# Patient Record
Sex: Female | Born: 1955
Health system: Southern US, Community
[De-identification: ages and names within clinical notes are randomized; demographics above are authoritative.]

## PROBLEM LIST (undated history)

## (undated) DIAGNOSIS — C55 Malignant neoplasm of uterus, part unspecified: Secondary | ICD-10-CM

## (undated) DIAGNOSIS — Z8742 Personal history of other diseases of the female genital tract: Secondary | ICD-10-CM

## (undated) DIAGNOSIS — N950 Postmenopausal bleeding: Secondary | ICD-10-CM

## (undated) DIAGNOSIS — R9389 Abnormal findings on diagnostic imaging of other specified body structures: Secondary | ICD-10-CM

## (undated) DIAGNOSIS — T7840XA Allergy, unspecified, initial encounter: Secondary | ICD-10-CM

## (undated) HISTORY — PX: CERVICAL CONE BIOPSY: SUR198

## (undated) HISTORY — DX: Allergy, unspecified, initial encounter: T78.40XA

## (undated) HISTORY — DX: Malignant neoplasm of uterus, part unspecified: C55

---

## 2000-12-31 ENCOUNTER — Other Ambulatory Visit: Admission: RE | Admit: 2000-12-31 | Discharge: 2000-12-31 | Payer: Self-pay | Admitting: Internal Medicine

## 2002-04-20 ENCOUNTER — Other Ambulatory Visit: Admission: RE | Admit: 2002-04-20 | Discharge: 2002-04-20 | Payer: Self-pay | Admitting: Internal Medicine

## 2004-05-02 ENCOUNTER — Other Ambulatory Visit: Admission: RE | Admit: 2004-05-02 | Discharge: 2004-05-02 | Payer: Self-pay | Admitting: Cardiology

## 2005-11-01 ENCOUNTER — Other Ambulatory Visit: Admission: RE | Admit: 2005-11-01 | Discharge: 2005-11-01 | Payer: Self-pay | Admitting: Cardiology

## 2007-04-11 ENCOUNTER — Other Ambulatory Visit: Admission: RE | Admit: 2007-04-11 | Discharge: 2007-04-11 | Payer: Self-pay | Admitting: Internal Medicine

## 2007-07-23 ENCOUNTER — Ambulatory Visit (HOSPITAL_COMMUNITY): Admission: RE | Admit: 2007-07-23 | Discharge: 2007-07-23 | Payer: Self-pay | Admitting: *Deleted

## 2008-10-21 ENCOUNTER — Other Ambulatory Visit: Admission: RE | Admit: 2008-10-21 | Discharge: 2008-10-21 | Payer: Self-pay | Admitting: Internal Medicine

## 2010-08-04 ENCOUNTER — Other Ambulatory Visit (HOSPITAL_COMMUNITY)
Admission: RE | Admit: 2010-08-04 | Discharge: 2010-08-04 | Disposition: A | Discharge status: Home / Self Care | Payer: 59 | Source: Ambulatory Visit | Attending: Internal Medicine | Admitting: Internal Medicine

## 2010-08-04 DIAGNOSIS — Z01419 Encounter for gynecological examination (general) (routine) without abnormal findings: Secondary | ICD-10-CM | POA: Insufficient documentation

## 2010-10-31 NOTE — Op Note (Signed)
Brooke [name], Brooke [name]                ACCOUNT NO.:  [id number]   MEDICAL RECORD NO.:  [id number]          PATIENT TYPE:  AMB   LOCATION:  ENDO                         FACILITY:  [facility]   PHYSICIAN:  Georgiana Spinner, M.D.    DATE OF BIRTH:  July 26, 1955   DATE OF PROCEDURE:  07/23/2007  DATE OF DISCHARGE:                               OPERATIVE REPORT   PROCEDURE:  Colonoscopy.   INDICATIONS:  Colon cancer screening.   ANESTHESIA:  1. Fentanyl 100 mcg.  2. Versed 6 mg.   PROCEDURE:  With the patient mildly sedated in the left lateral  decubitus position, the Pentax videoscopic colonoscope was inserted in  the rectum and passed under direct vision to the cecum, identified by  the ileocecal valve and appendiceal orifice, both of which were  photographed.  From this point, the colonoscope was slowly withdrawn,  taking circumferential views of the colonic mucosa, stopping only in the  rectum, which showed hemorrhoids on direct view from anal canal and on  retroflexed view as well.  The endoscope was straightened and withdrawn.  The patient's vital signs and pulse oximeter remained stable.  The  patient tolerated procedure well, without apparent complication.   FINDINGS:  Internal hemorrhoids, otherwise an unremarkable exam.   PLAN:  Consider repeat examination in 5-10 years.           ______________________________  Georgiana Spinner, M.D.     GMO/MEDQ  D:  07/23/2007  T:  07/24/2007  Job:  [id number]

## 2013-08-17 ENCOUNTER — Other Ambulatory Visit (HOSPITAL_COMMUNITY)
Admission: RE | Admit: 2013-08-17 | Discharge: 2013-08-17 | Disposition: A | Discharge status: Home / Self Care | Payer: 59 | Source: Ambulatory Visit | Attending: Internal Medicine | Admitting: Internal Medicine

## 2013-08-17 DIAGNOSIS — Z01419 Encounter for gynecological examination (general) (routine) without abnormal findings: Secondary | ICD-10-CM | POA: Insufficient documentation

## 2014-08-24 ENCOUNTER — Other Ambulatory Visit: Payer: Self-pay

## 2014-08-24 DIAGNOSIS — Z1231 Encounter for screening mammogram for malignant neoplasm of breast: Secondary | ICD-10-CM

## 2014-08-26 ENCOUNTER — Ambulatory Visit
Admission: RE | Admit: 2014-08-26 | Discharge: 2014-08-26 | Disposition: A | Discharge status: Home / Self Care | Payer: 59 | Source: Ambulatory Visit

## 2014-08-26 DIAGNOSIS — Z1231 Encounter for screening mammogram for malignant neoplasm of breast: Secondary | ICD-10-CM

## 2015-08-12 ENCOUNTER — Other Ambulatory Visit: Payer: Self-pay

## 2015-08-12 DIAGNOSIS — Z1231 Encounter for screening mammogram for malignant neoplasm of breast: Secondary | ICD-10-CM

## 2015-08-17 DIAGNOSIS — L820 Inflamed seborrheic keratosis: Secondary | ICD-10-CM | POA: Diagnosis not present

## 2015-08-17 DIAGNOSIS — D2372 Other benign neoplasm of skin of left lower limb, including hip: Secondary | ICD-10-CM | POA: Diagnosis not present

## 2015-08-17 DIAGNOSIS — D2262 Melanocytic nevi of left upper limb, including shoulder: Secondary | ICD-10-CM | POA: Diagnosis not present

## 2015-08-17 DIAGNOSIS — L821 Other seborrheic keratosis: Secondary | ICD-10-CM | POA: Diagnosis not present

## 2015-08-17 DIAGNOSIS — L819 Disorder of pigmentation, unspecified: Secondary | ICD-10-CM | POA: Diagnosis not present

## 2015-08-17 DIAGNOSIS — D2261 Melanocytic nevi of right upper limb, including shoulder: Secondary | ICD-10-CM | POA: Diagnosis not present

## 2015-08-17 DIAGNOSIS — D225 Melanocytic nevi of trunk: Secondary | ICD-10-CM | POA: Diagnosis not present

## 2015-08-17 DIAGNOSIS — D2272 Melanocytic nevi of left lower limb, including hip: Secondary | ICD-10-CM | POA: Diagnosis not present

## 2015-08-17 DIAGNOSIS — D235 Other benign neoplasm of skin of trunk: Secondary | ICD-10-CM | POA: Diagnosis not present

## 2015-08-22 ENCOUNTER — Ambulatory Visit: Payer: Self-pay | Admitting: Obstetrics & Gynecology

## 2015-08-22 DIAGNOSIS — Z01419 Encounter for gynecological examination (general) (routine) without abnormal findings: Secondary | ICD-10-CM | POA: Diagnosis not present

## 2015-08-22 DIAGNOSIS — N840 Polyp of corpus uteri: Secondary | ICD-10-CM | POA: Diagnosis not present

## 2015-08-22 DIAGNOSIS — N950 Postmenopausal bleeding: Secondary | ICD-10-CM | POA: Diagnosis not present

## 2015-08-23 ENCOUNTER — Encounter (HOSPITAL_BASED_OUTPATIENT_CLINIC_OR_DEPARTMENT_OTHER): Payer: Self-pay | Admitting: *Deleted

## 2015-08-23 NOTE — Anesthesia Preprocedure Evaluation (Addendum)
Anesthesia Evaluation  Patient identified by MRN, date of birth, ID band Patient awake    Reviewed: Allergy & Precautions, NPO status , Patient's Chart, lab work & pertinent test results  History of Anesthesia Complications Negative for: history of anesthetic complications  Airway Mallampati: II  TM Distance: >3 FB Neck ROM: Full    Dental no notable dental hx. (+) Dental Advisory Given   Pulmonary neg pulmonary ROS,    Pulmonary exam normal breath sounds clear to auscultation       Cardiovascular negative cardio ROS Normal cardiovascular exam Rhythm:Regular Rate:Normal     Neuro/Psych negative neurological ROS  negative psych ROS   GI/Hepatic negative GI ROS, Neg liver ROS,   Endo/Other  negative endocrine ROS  Renal/GU negative Renal ROS  negative genitourinary   Musculoskeletal negative musculoskeletal ROS (+)   Abdominal   Peds negative pediatric ROS (+)  Hematology negative hematology ROS (+)   Anesthesia Other Findings Dysfunctional uterine bleeding  Reproductive/Obstetrics negative OB ROS                            Anesthesia Physical Anesthesia Plan  ASA: II  Anesthesia Plan: General   Post-op Pain Management:    Induction: Intravenous  Airway Management Planned: LMA  Additional Equipment:   Intra-op Plan:   Post-operative Plan: Extubation in OR  Informed Consent: I have reviewed the patients History and Physical, chart, labs and discussed the procedure including the risks, benefits and alternatives for the proposed anesthesia with the patient or authorized representative who has indicated his/her understanding and acceptance.   Dental advisory given  Plan Discussed with: CRNA  Anesthesia Plan Comments:         Anesthesia Quick Evaluation

## 2015-08-23 NOTE — Progress Notes (Addendum)
UNABLE TO REACH PT LEFT MESSAGE ON HOME AND CELL TO BE NPO AFTER MN.  ARRIVE AT 0700.  NEEDS ISTAT AND HX.

## 2015-08-24 ENCOUNTER — Other Ambulatory Visit: Payer: Self-pay

## 2015-08-24 ENCOUNTER — Ambulatory Visit (HOSPITAL_BASED_OUTPATIENT_CLINIC_OR_DEPARTMENT_OTHER): Payer: 59 | Admitting: Anesthesiology

## 2015-08-24 ENCOUNTER — Encounter (HOSPITAL_BASED_OUTPATIENT_CLINIC_OR_DEPARTMENT_OTHER): Payer: Self-pay | Admitting: *Deleted

## 2015-08-24 ENCOUNTER — Encounter (HOSPITAL_BASED_OUTPATIENT_CLINIC_OR_DEPARTMENT_OTHER)
Admission: RE | Disposition: A | Discharge status: Home / Self Care | Payer: Self-pay | Source: Ambulatory Visit | Attending: Obstetrics & Gynecology

## 2015-08-24 ENCOUNTER — Ambulatory Visit (HOSPITAL_BASED_OUTPATIENT_CLINIC_OR_DEPARTMENT_OTHER)
Admission: RE | Admit: 2015-08-24 | Discharge: 2015-08-24 | Disposition: A | Discharge status: Home / Self Care | Payer: 59 | Source: Ambulatory Visit | Attending: Obstetrics & Gynecology | Admitting: Obstetrics & Gynecology

## 2015-08-24 DIAGNOSIS — C541 Malignant neoplasm of endometrium: Secondary | ICD-10-CM | POA: Diagnosis not present

## 2015-08-24 DIAGNOSIS — N840 Polyp of corpus uteri: Secondary | ICD-10-CM | POA: Diagnosis not present

## 2015-08-24 DIAGNOSIS — N950 Postmenopausal bleeding: Secondary | ICD-10-CM | POA: Diagnosis not present

## 2015-08-24 DIAGNOSIS — I10 Essential (primary) hypertension: Secondary | ICD-10-CM | POA: Insufficient documentation

## 2015-08-24 DIAGNOSIS — N858 Other specified noninflammatory disorders of uterus: Secondary | ICD-10-CM | POA: Diagnosis not present

## 2015-08-24 HISTORY — DX: Postmenopausal bleeding: N95.0

## 2015-08-24 HISTORY — DX: Personal history of other diseases of the female genital tract: Z87.42

## 2015-08-24 HISTORY — DX: Abnormal findings on diagnostic imaging of other specified body structures: R93.89

## 2015-08-24 HISTORY — PX: HYSTEROSCOPY WITH D & C: SHX1775

## 2015-08-24 LAB — POCT I-STAT 4, (NA,K, GLUC, HGB,HCT)
GLUCOSE: 100 mg/dL — AB (ref 65–99)
HEMATOCRIT: 39 % (ref 36.0–46.0)
HEMOGLOBIN: 13.3 g/dL (ref 12.0–15.0)
Potassium: 4.3 mmol/L (ref 3.5–5.1)
Sodium: 141 mmol/L (ref 135–145)

## 2015-08-24 SURGERY — DILATATION AND CURETTAGE /HYSTEROSCOPY
Anesthesia: General | Site: Vagina

## 2015-08-24 MED ORDER — DEXAMETHASONE SODIUM PHOSPHATE 10 MG/ML IJ SOLN
INTRAMUSCULAR | Status: AC
  Filled 2015-08-24: qty 1

## 2015-08-24 MED ORDER — LACTATED RINGERS IV SOLN
INTRAVENOUS | Status: DC
  Administered 2015-08-24 (×2): via INTRAVENOUS
  Filled 2015-08-24: qty 1000

## 2015-08-24 MED ORDER — LIDOCAINE HCL (CARDIAC) 20 MG/ML IV SOLN
INTRAVENOUS | Status: DC | PRN
  Administered 2015-08-24: 60 mg via INTRAVENOUS

## 2015-08-24 MED ORDER — ONDANSETRON HCL 4 MG/2ML IJ SOLN
INTRAMUSCULAR | Status: AC
  Filled 2015-08-24: qty 2

## 2015-08-24 MED ORDER — LIDOCAINE HCL (CARDIAC) 20 MG/ML IV SOLN
INTRAVENOUS | Status: AC
  Filled 2015-08-24: qty 5

## 2015-08-24 MED ORDER — FENTANYL CITRATE (PF) 100 MCG/2ML IJ SOLN
25.0000 ug | INTRAMUSCULAR | Status: DC | PRN
  Filled 2015-08-24: qty 1

## 2015-08-24 MED ORDER — ONDANSETRON HCL 4 MG/2ML IJ SOLN
4.0000 mg | Freq: Once | INTRAMUSCULAR | Status: DC | PRN
  Filled 2015-08-24: qty 2

## 2015-08-24 MED ORDER — DEXAMETHASONE SODIUM PHOSPHATE 4 MG/ML IJ SOLN
INTRAMUSCULAR | Status: DC | PRN
  Administered 2015-08-24: 10 mg via INTRAVENOUS

## 2015-08-24 MED ORDER — FENTANYL CITRATE (PF) 100 MCG/2ML IJ SOLN
INTRAMUSCULAR | Status: DC | PRN
  Administered 2015-08-24 (×4): 25 ug via INTRAVENOUS

## 2015-08-24 MED ORDER — ARTIFICIAL TEARS OP OINT
TOPICAL_OINTMENT | OPHTHALMIC | Status: AC
  Filled 2015-08-24: qty 3.5

## 2015-08-24 MED ORDER — CLINDAMYCIN PHOSPHATE 600 MG/50ML IV SOLN
600.0000 mg | Freq: Once | INTRAVENOUS | Status: AC
  Administered 2015-08-24: 600 mg via INTRAVENOUS
  Filled 2015-08-24: qty 50

## 2015-08-24 MED ORDER — ONDANSETRON HCL 4 MG/2ML IJ SOLN
INTRAMUSCULAR | Status: DC | PRN
  Administered 2015-08-24: 4 mg via INTRAVENOUS

## 2015-08-24 MED ORDER — FENTANYL CITRATE (PF) 100 MCG/2ML IJ SOLN
INTRAMUSCULAR | Status: AC
  Filled 2015-08-24: qty 2

## 2015-08-24 MED ORDER — MIDAZOLAM HCL 2 MG/2ML IJ SOLN
INTRAMUSCULAR | Status: AC
Start: 2015-08-24 — End: 2015-08-24
  Filled 2015-08-24: qty 2

## 2015-08-24 MED ORDER — MIDAZOLAM HCL 5 MG/5ML IJ SOLN
INTRAMUSCULAR | Status: DC | PRN
  Administered 2015-08-24: 2 mg via INTRAVENOUS

## 2015-08-24 MED ORDER — CLINDAMYCIN PHOSPHATE 600 MG/50ML IV SOLN
INTRAVENOUS | Status: AC
  Filled 2015-08-24: qty 50

## 2015-08-24 MED ORDER — PROPOFOL 10 MG/ML IV BOLUS
INTRAVENOUS | Status: AC
  Filled 2015-08-24: qty 40

## 2015-08-24 MED ORDER — GLYCINE 1.5 % IR SOLN
Status: DC | PRN
  Administered 2015-08-24: 3000 mL

## 2015-08-24 MED ORDER — PROPOFOL 10 MG/ML IV BOLUS
INTRAVENOUS | Status: DC | PRN
  Administered 2015-08-24: 160 mg via INTRAVENOUS

## 2015-08-24 MED FILL — HYDROCODON-APAP 5-325: 5-325 | 2 days supply | Qty: 10 | Fill #0

## 2015-08-24 SURGICAL SUPPLY — 33 items
ABLATOR ENDOMETRIAL BIPOLAR (ABLATOR) IMPLANT
CANISTER SUCTION 2500CC (MISCELLANEOUS) ×2 IMPLANT
CATH ROBINSON RED A/P 16FR (CATHETERS) IMPLANT
COVER BACK TABLE 60X90IN (DRAPES) ×2 IMPLANT
DRAPE HYSTEROSCOPY (DRAPE) ×2 IMPLANT
DRAPE LG THREE QUARTER DISP (DRAPES) ×2 IMPLANT
ELECT REM PT RETURN 9FT ADLT (ELECTROSURGICAL)
ELECTRODE REM PT RTRN 9FT ADLT (ELECTROSURGICAL) IMPLANT
GLOVE BIO SURGEON STRL SZ7.5 (GLOVE) ×2 IMPLANT
GLOVE BIOGEL M 6.5 STRL (GLOVE) ×2 IMPLANT
GLOVE BIOGEL PI IND STRL 6.5 (GLOVE) ×1 IMPLANT
GLOVE BIOGEL PI IND STRL 7.5 (GLOVE) ×1 IMPLANT
GLOVE BIOGEL PI INDICATOR 6.5 (GLOVE) ×1
GLOVE BIOGEL PI INDICATOR 7.5 (GLOVE) ×1
GOWN STRL REUS W/ TWL LRG LVL3 (GOWN DISPOSABLE) ×1 IMPLANT
GOWN STRL REUS W/ TWL XL LVL3 (GOWN DISPOSABLE) ×2 IMPLANT
GOWN STRL REUS W/TWL LRG LVL3 (GOWN DISPOSABLE) ×1
GOWN STRL REUS W/TWL XL LVL3 (GOWN DISPOSABLE) ×2
KIT ROOM TURNOVER WOR (KITS) ×2 IMPLANT
LEGGING LITHOTOMY PAIR STRL (DRAPES) ×2 IMPLANT
LOOP ANGLED CUTTING 22FR (CUTTING LOOP) IMPLANT
MANIFOLD NEPTUNE II (INSTRUMENTS) IMPLANT
NEEDLE SPNL 22GX3.5 QUINCKE BK (NEEDLE) IMPLANT
PACK BASIN DAY SURGERY FS (CUSTOM PROCEDURE TRAY) ×2 IMPLANT
PAD OB MATERNITY 4.3X12.25 (PERSONAL CARE ITEMS) ×2 IMPLANT
PAD PREP 24X48 CUFFED NSTRL (MISCELLANEOUS) ×2 IMPLANT
SYR CONTROL 10ML LL (SYRINGE) IMPLANT
TOWEL OR 17X24 6PK STRL BLUE (TOWEL DISPOSABLE) ×4 IMPLANT
TRAY DSU PREP LF (CUSTOM PROCEDURE TRAY) ×2 IMPLANT
TUBE CONNECTING 12X1/4 (SUCTIONS) IMPLANT
TUBING AQUILEX INFLOW (TUBING) ×2 IMPLANT
TUBING AQUILEX OUTFLOW (TUBING) ×2 IMPLANT
WATER STERILE IRR 1000ML POUR (IV SOLUTION) ×2 IMPLANT

## 2015-08-24 NOTE — Anesthesia Procedure Notes (Signed)
Procedure Name: LMA Insertion Date/Time: 08/24/2015 8:36 AM Performed by: Mechele Claude Pre-anesthesia Checklist: Patient identified, Emergency Drugs available, Suction available and Patient being monitored Patient Re-evaluated:Patient Re-evaluated prior to inductionOxygen Delivery Method: Circle System Utilized Preoxygenation: Pre-oxygenation with 100% oxygen Intubation Type: IV induction Ventilation: Mask ventilation without difficulty LMA: LMA inserted LMA Size: 4.0 Number of attempts: 1 Airway Equipment and Method: bite block Placement Confirmation: positive ETCO2 Tube secured with: Tape Dental Injury: Teeth and Oropharynx as per pre-operative assessment

## 2015-08-24 NOTE — Discharge Instructions (Signed)

## 2015-08-24 NOTE — Transfer of Care (Signed)
  Last Vitals:  Filed Vitals:   08/24/15 0726  BP: 123/79  Pulse: 59  Temp: 37.1 C  Resp: 16    Immediate Anesthesia Transfer of Care Note  Patient: Brooke [name]  Procedure(s) Performed: Procedure(s) (LRB): DILATATION AND CURETTAGE /HYSTEROSCOPY (N/A)  Patient Location: PACU  Anesthesia Type: General  Level of Consciousness: awake, alert  and oriented  Airway & Oxygen Therapy: Patient Spontanous Breathing and Patient connected to face mask oxygen  Post-op Assessment: Report given to PACU RN and Post -op Vital signs reviewed and stable  Post vital signs: Reviewed and stable  Complications: No apparent anesthesia complications

## 2015-08-24 NOTE — Op Note (Signed)
NAMEJANERA, [name] NO.:  [id number]  MEDICAL RECORD NO.:  [id number]  LOCATION:                                 FACILITY:  PHYSICIAN:  Maisie Fus, M.D.   DATE OF BIRTH:  [birth year]  DATE OF PROCEDURE:  08/24/2015 DATE OF DISCHARGE:                              OPERATIVE REPORT   PREOPERATIVE DIAGNOSIS:  Postmenopausal bleeding with 2.5-cm mass effect in the uterine cavity with a note of increased blood flow to the mass.  POSTOPERATIVE DIAGNOSIS:  Narcotic tumor-looking material.  OPERATIVE PROCEDURE:  Hysteroscopy, D and C.  SURGEON:  Maisie Fus, M.D.  ANESTHESIA:  General.  ESTIMATED INTRAOPERATIVE BLOOD LOSS:  100 mL.  INTRAOPERATIVE COMPLICATIONS:  None.  The patient was referred to our office by her dermatologist after report of postmenopausal bleeding, which apparently have been discussed with general practitioner's office in the past, but with no previous workup. At her initial visit here in the office, she had a pelvic ultrasound, which showed 2.5-cm mass of tissue in the uterine cavity with an increased in blood flow by ultrasound findings.  She was admitted now for hysteroscopy, D and C.  DESCRIPTION OF PROCEDURE:  She was admitted in the morning of surgery. She was brought to the operating room and placed under general anesthesia, placed in dorsal lithotomy position using the [facility] stirrup system.  Betadine prep of mons, perineum and vagina was carried out in the usual fashion.  Sterile drapes were applied.  Bivalve speculum was then introduced because of narrowing of the vaginal introitus.  This allowed visualization of the cervix, which was somewhat flushed with the vaginal vault.  There was what appeared to be polypoid-like tissue coming from the os.  Using a Hegar dilator, the cervix was progressively dilated to 23.  The hysteroscope was introduced, but because of inability to clear the bloody-looking material, no definite  image of a mass could be obtained.  It was elected to place a Heaney curette and gentle thorough curettage with finding of large amount of disintegrating necrotic-looking tissue consistent with malignant-type tissue.  This was repeated and continued to was felt like the cavity had been evacuated. Repeated attempts to visualize were unsuccessful because of blood staining and for that reason, no actual images of the tissue could be obtained with the hysteroscope.  At this point, the procedure was terminated.  Instruments removed.  The patient was awakened, she was taken to recovery in good condition.  She will be given routine outpatient surgical instructions.  She will likely be referred in the very near future to [facility].     Maisie Fus, M.D.     WRN/MEDQ  D:  08/24/2015  T:  08/24/2015  Job:  [id number]

## 2015-08-24 NOTE — Anesthesia Postprocedure Evaluation (Signed)
Anesthesia Post Note  Patient: Brooke [name]  Procedure(s) Performed: Procedure(s) (LRB): DILATATION AND CURETTAGE /HYSTEROSCOPY (N/A)  Patient location during evaluation: PACU Anesthesia Type: General Level of consciousness: awake and alert Pain management: pain level controlled Vital Signs Assessment: post-procedure vital signs reviewed and stable Respiratory status: spontaneous breathing, nonlabored ventilation, respiratory function stable and patient connected to nasal cannula oxygen Cardiovascular status: blood pressure returned to baseline and stable Postop Assessment: no signs of nausea or vomiting Anesthetic complications: no    Last Vitals:  Filed Vitals:   08/24/15 1000 08/24/15 1010  BP: 120/69   Pulse: 43 40  Temp:    Resp: 15 15    Last Pain:  Filed Vitals:   08/24/15 1023  PainSc: Asleep                 Airianna Kreischer JENNETTE

## 2015-08-25 ENCOUNTER — Encounter (HOSPITAL_BASED_OUTPATIENT_CLINIC_OR_DEPARTMENT_OTHER): Payer: Self-pay | Admitting: Obstetrics & Gynecology

## 2015-08-26 DIAGNOSIS — E559 Vitamin D deficiency, unspecified: Secondary | ICD-10-CM | POA: Diagnosis not present

## 2015-08-26 DIAGNOSIS — Z13220 Encounter for screening for lipoid disorders: Secondary | ICD-10-CM | POA: Diagnosis not present

## 2015-08-26 DIAGNOSIS — Z0001 Encounter for general adult medical examination with abnormal findings: Secondary | ICD-10-CM | POA: Diagnosis not present

## 2015-08-30 ENCOUNTER — Ambulatory Visit
Admission: RE | Admit: 2015-08-30 | Discharge: 2015-08-30 | Disposition: A | Discharge status: Home / Self Care | Payer: 59 | Source: Ambulatory Visit

## 2015-08-30 DIAGNOSIS — Z1231 Encounter for screening mammogram for malignant neoplasm of breast: Secondary | ICD-10-CM

## 2015-08-31 DIAGNOSIS — E559 Vitamin D deficiency, unspecified: Secondary | ICD-10-CM | POA: Diagnosis not present

## 2015-08-31 DIAGNOSIS — Z0000 Encounter for general adult medical examination without abnormal findings: Secondary | ICD-10-CM | POA: Diagnosis not present

## 2015-08-31 DIAGNOSIS — R945 Abnormal results of liver function studies: Secondary | ICD-10-CM | POA: Diagnosis not present

## 2015-09-02 ENCOUNTER — Other Ambulatory Visit (HOSPITAL_COMMUNITY): Payer: Self-pay | Admitting: Internal Medicine

## 2015-09-02 DIAGNOSIS — R748 Abnormal levels of other serum enzymes: Secondary | ICD-10-CM

## 2015-09-06 ENCOUNTER — Ambulatory Visit (HOSPITAL_COMMUNITY)
Admission: RE | Admit: 2015-09-06 | Discharge: 2015-09-06 | Disposition: A | Discharge status: Home / Self Care | Payer: 59 | Source: Ambulatory Visit | Attending: Internal Medicine | Admitting: Internal Medicine

## 2015-09-06 DIAGNOSIS — K760 Fatty (change of) liver, not elsewhere classified: Secondary | ICD-10-CM | POA: Insufficient documentation

## 2015-09-06 DIAGNOSIS — R748 Abnormal levels of other serum enzymes: Secondary | ICD-10-CM | POA: Diagnosis not present

## 2015-09-07 ENCOUNTER — Ambulatory Visit: Payer: 59 | Attending: Gynecologic Oncology | Admitting: Gynecologic Oncology

## 2015-09-07 ENCOUNTER — Encounter: Payer: Self-pay | Admitting: Gynecologic Oncology

## 2015-09-07 VITALS — BP 129/90 | HR 64 | Temp 97.6°F | Resp 18 | Ht 64.25 in | Wt 169.5 lb

## 2015-09-07 DIAGNOSIS — R7989 Other specified abnormal findings of blood chemistry: Secondary | ICD-10-CM | POA: Diagnosis not present

## 2015-09-07 DIAGNOSIS — K760 Fatty (change of) liver, not elsewhere classified: Secondary | ICD-10-CM | POA: Insufficient documentation

## 2015-09-07 DIAGNOSIS — E7800 Pure hypercholesterolemia, unspecified: Secondary | ICD-10-CM | POA: Insufficient documentation

## 2015-09-07 DIAGNOSIS — E781 Pure hyperglyceridemia: Secondary | ICD-10-CM | POA: Insufficient documentation

## 2015-09-07 DIAGNOSIS — Z880 Allergy status to penicillin: Secondary | ICD-10-CM | POA: Diagnosis not present

## 2015-09-07 DIAGNOSIS — C541 Malignant neoplasm of endometrium: Secondary | ICD-10-CM | POA: Insufficient documentation

## 2015-09-07 DIAGNOSIS — E663 Overweight: Secondary | ICD-10-CM | POA: Insufficient documentation

## 2015-09-07 DIAGNOSIS — N950 Postmenopausal bleeding: Secondary | ICD-10-CM | POA: Insufficient documentation

## 2015-09-07 NOTE — Patient Instructions (Signed)
Plan for surgery at [facility] with Dr. Alycia Rossetti on April 7.  Your pre-operative appointment at [facility] will be March 27 at 1:30pm.  Please call for any questions or concerns.

## 2015-09-07 NOTE — Progress Notes (Signed)
Consult Note: Gyn-Onc  Consult was requested by Dr. Nori Riis for the evaluation of Brooke [name] 60 y.o. female  CC:  Chief Complaint  Patient presents with  . endometrial cancer    New Consultation    Assessment/Plan:  Brooke [name]  is a 60 y.o.  year old with clinical stage I grade 2 endometrial cancer. Mildly elevated LFT's (due to hepatic steatosis). Hypercholesterolemia, not on statins.  A detailed discussion was held with the patient and her family with regard to to her endometrial cancer diagnosis. We discussed the standard management options for uterine cancer which includes surgery followed possibly by adjuvant therapy depending on the results of surgery. The options for surgical management include a hysterectomy and removal of the tubes and ovaries with sentinel lymph node biopsy, possibly with removal of pelvic and para-aortic lymph nodes. A minimally invasive approach including a robotic hysterectomy or laparoscopic hysterectomy have benefits including shorter hospital stay, recovery time and better wound healing. The alternative approach is an open hysterectomy. The patient has been counseled about these surgical options and the risks of surgery in general including infection, bleeding, damage to surrounding structures (including bowel, bladder, ureters, nerves or vessels), and the postoperative risks of PE/ DVT, and lymphedema. I extensively reviewed the additional risks of robotic hysterectomy including possible need for conversion to open laparotomy.  I discussed positioning during surgery of trendelenberg and risks of minor facial swelling and care we take in preoperative positioning.  After counseling and consideration of her options, she desires to proceed with robotic hysterectomy, BSO, SLN biopsy.   She desires surgery to be expedited and lives in [place] near [place], therefore I am scheduling her to have the surgery performed by my partner, Dr Nancy Marus, at [facility] on  April 7th.  She will be seen by anesthesia for preoperative clearance and discussion of postoperative pain management.  She was given the opportunity to ask questions, which were answered to her satisfaction, and she is agreement with the above mentioned plan of care.  HPI: Brooke [name] is a very pleasant G0 woman who is seen in consultation at the request of Dr Nori Riis for grade 2 endometrial cancer.  The patient has a history of post-menopausal bleeding since October 2016.  She underwent US of the pelvis on 08/22/15 with Dr Nori Riis which revealed a uterus measuring 6.9 x 3.3x5.1cm and a thickened endometrial stripe.  A hysteroscopy, D&C was performed on 08/24/15 and pathology revealed FIGO grade 2 endometrial cancer.    She is otherwise very healthy. Postoperative compressive metabolic panel drawn on [id number] reveals a mildly elevated ALC at 55 (upper limit of normal is 32). AST was also mildly elevated at 37 (upper limit of normal is 32). She subsequently had an abdominal ultrasound scan on 09/16/2015 which revealed a liver with mild increased echogenicity compatible with hepatic steatosis. Otherwise the ultrasound was unremarkable. She has a history of hypertriglyceridemia and takes 1000 mg of fish oil for this but no statins. She's had no prior abdominal surgeries.  She has a remote history of a CKC in 2996 for high grade dysplasia, but normal paps since that time.  She is nulliparous.   She has no known family history for malignancy because she is adopted.  She is overweight with a BMI of 28.8kg/m2.   Current Meds:  Outpatient Encounter Prescriptions as of 09/07/2015  Medication Sig  . calcium carbonate (OS-CAL - DOSED IN MG OF ELEMENTAL CALCIUM) 1250 (500 Ca) MG tablet Take  2 tablets by mouth 2 (two) times daily with a meal.  . cetirizine (ZYRTEC) 10 MG tablet Take 10 mg by mouth daily.  . Cholecalciferol (VITAMIN D3) 2000 units TABS Take 2,000 tablets by mouth daily.  . [DISCONTINUED]  Vitamin D, Ergocalciferol, (DRISDOL) 50000 units CAPS capsule Take 4,000 Units by mouth every 7 (seven) days.   No facility-administered encounter medications on file as of 09/07/2015.    Allergy:  Allergies  Allergen Reactions  . Penicillins Swelling    Rash,swelling on face,upper chest  . Strawberry (Diagnostic) Rash    Social Hx:   Social History   Social History  . Marital Status: Married    Spouse Name: N/A  . Number of Children: N/A  . Years of Education: N/A   Occupational History  . Not on file.   Social History Main Topics  . Smoking status: Never Smoker   . Smokeless tobacco: Never Used  . Alcohol Use: 1.2 oz/week    2 Cans of beer per week  . Drug Use: No  . Sexual Activity: Not on file   Other Topics Concern  . Not on file   Social History Narrative    Past Surgical Hx:  Past Surgical History  Procedure Laterality Date  . Cervical cone biopsy    . Hysteroscopy w/d&c N/A 08/24/2015    Procedure: DILATATION AND CURETTAGE /HYSTEROSCOPY;  Surgeon: Maisie Fus, MD;  Location: [facility];  Service: Gynecology;  Laterality: N/A;    Past Medical Hx:  Past Medical History  Diagnosis Date  . History of abnormal cervical Pap smear   . PMB (postmenopausal bleeding)   . Thickened endometrium   . Allergy     Past Gynecological History:  CKC for dysplasia 1996. G0  No LMP recorded. Patient is postmenopausal.  Family Hx:  Family History  Problem Relation Age of Onset  . Adopted: Yes    Review of Systems:  Constitutional  Feels well,    ENT Normal appearing ears and nares bilaterally Skin/Breast  No rash, sores, jaundice, itching, dryness Cardiovascular  No chest pain, shortness of breath, or edema  Pulmonary  No cough or wheeze.  Gastro Intestinal  No nausea, vomitting, or diarrhoea. No bright red blood per rectum, no abdominal pain, change in bowel movement, or constipation.  Genito Urinary  No frequency, urgency, dysuria, +  PMP bleeding Musculo Skeletal  No myalgia, arthralgia, joint swelling or pain  Neurologic  No weakness, numbness, change in gait,  Psychology  No depression, anxiety, insomnia.   Vitals:  Blood pressure 129/90, pulse 64, temperature 97.6 F (36.4 C), temperature source Oral, resp. rate 18, height 5' 4.25" (1.632 m), weight 169 lb 8 oz (76.885 kg), SpO2 100 %.  Physical Exam: WD in NAD Neck  Supple NROM, without any enlargements.  Lymph Node Survey No cervical supraclavicular or inguinal adenopathy Cardiovascular  Pulse normal rate, regularity and rhythm. S1 and S2 normal.  Lungs  Clear to auscultation bilateraly, without wheezes/crackles/rhonchi. Good air movement.  Skin  No rash/lesions/breakdown  Psychiatry  Alert and oriented to person, place, and time  Abdomen  Normoactive bowel sounds, abdomen soft, non-tender and slightly overweight without evidence of hernia. Back No CVA tenderness Genito Urinary  Vulva/vagina: Normal external female genitalia.  No lesions. No discharge or bleeding.  Bladder/urethra:  No lesions or masses, well supported bladder  Vagina: normal  Cervix: Normal appearing, no lesions. There is some scaring and tethering of the posterior vaginal fornix to the cervix,  most consistent with scar after CKC.  Uterus: Small, mobile, no parametrial involvement or nodularity.  Adnexa: no palpable masses. Rectal  Good tone, no masses no cul de sac nodularity.  Extremities  No bilateral cyanosis, clubbing or edema.   Donaciano Eva, MD  09/07/2015, 12:50 PM

## 2015-09-08 DIAGNOSIS — R749 Abnormal serum enzyme level, unspecified: Secondary | ICD-10-CM | POA: Diagnosis not present

## 2015-09-12 DIAGNOSIS — K760 Fatty (change of) liver, not elsewhere classified: Secondary | ICD-10-CM | POA: Diagnosis not present

## 2015-09-12 DIAGNOSIS — Z6828 Body mass index (BMI) 28.0-28.9, adult: Secondary | ICD-10-CM | POA: Diagnosis not present

## 2015-09-12 DIAGNOSIS — C541 Malignant neoplasm of endometrium: Secondary | ICD-10-CM | POA: Diagnosis not present

## 2015-09-12 DIAGNOSIS — Z79891 Long term (current) use of opiate analgesic: Secondary | ICD-10-CM | POA: Diagnosis not present

## 2015-09-12 DIAGNOSIS — E663 Overweight: Secondary | ICD-10-CM | POA: Diagnosis not present

## 2015-09-12 DIAGNOSIS — Z01818 Encounter for other preprocedural examination: Secondary | ICD-10-CM | POA: Diagnosis not present

## 2015-09-12 DIAGNOSIS — Z87898 Personal history of other specified conditions: Secondary | ICD-10-CM | POA: Diagnosis not present

## 2015-09-12 DIAGNOSIS — J309 Allergic rhinitis, unspecified: Secondary | ICD-10-CM | POA: Diagnosis not present

## 2015-09-12 DIAGNOSIS — E781 Pure hyperglyceridemia: Secondary | ICD-10-CM | POA: Diagnosis not present

## 2015-09-20 DIAGNOSIS — H5203 Hypermetropia, bilateral: Secondary | ICD-10-CM | POA: Diagnosis not present

## 2015-09-20 DIAGNOSIS — H524 Presbyopia: Secondary | ICD-10-CM | POA: Diagnosis not present

## 2015-09-20 DIAGNOSIS — H52223 Regular astigmatism, bilateral: Secondary | ICD-10-CM | POA: Diagnosis not present

## 2015-09-23 DIAGNOSIS — N858 Other specified noninflammatory disorders of uterus: Secondary | ICD-10-CM | POA: Diagnosis not present

## 2015-09-23 DIAGNOSIS — J309 Allergic rhinitis, unspecified: Secondary | ICD-10-CM | POA: Diagnosis not present

## 2015-09-23 DIAGNOSIS — D259 Leiomyoma of uterus, unspecified: Secondary | ICD-10-CM | POA: Diagnosis not present

## 2015-09-23 DIAGNOSIS — Z6828 Body mass index (BMI) 28.0-28.9, adult: Secondary | ICD-10-CM | POA: Diagnosis not present

## 2015-09-23 DIAGNOSIS — C541 Malignant neoplasm of endometrium: Secondary | ICD-10-CM | POA: Diagnosis not present

## 2015-09-23 DIAGNOSIS — N838 Other noninflammatory disorders of ovary, fallopian tube and broad ligament: Secondary | ICD-10-CM | POA: Diagnosis not present

## 2015-09-23 DIAGNOSIS — N9489 Other specified conditions associated with female genital organs and menstrual cycle: Secondary | ICD-10-CM | POA: Diagnosis not present

## 2015-09-23 DIAGNOSIS — E663 Overweight: Secondary | ICD-10-CM | POA: Diagnosis not present

## 2015-09-23 DIAGNOSIS — N888 Other specified noninflammatory disorders of cervix uteri: Secondary | ICD-10-CM | POA: Diagnosis not present

## 2015-09-24 DIAGNOSIS — D259 Leiomyoma of uterus, unspecified: Secondary | ICD-10-CM | POA: Diagnosis not present

## 2015-09-24 DIAGNOSIS — J309 Allergic rhinitis, unspecified: Secondary | ICD-10-CM | POA: Diagnosis not present

## 2015-09-24 DIAGNOSIS — C541 Malignant neoplasm of endometrium: Secondary | ICD-10-CM | POA: Diagnosis not present

## 2015-09-24 DIAGNOSIS — E663 Overweight: Secondary | ICD-10-CM | POA: Diagnosis not present

## 2015-09-24 DIAGNOSIS — N838 Other noninflammatory disorders of ovary, fallopian tube and broad ligament: Secondary | ICD-10-CM | POA: Diagnosis not present

## 2015-09-24 DIAGNOSIS — N9489 Other specified conditions associated with female genital organs and menstrual cycle: Secondary | ICD-10-CM | POA: Diagnosis not present

## 2015-09-24 DIAGNOSIS — N888 Other specified noninflammatory disorders of cervix uteri: Secondary | ICD-10-CM | POA: Diagnosis not present

## 2015-09-24 DIAGNOSIS — N858 Other specified noninflammatory disorders of uterus: Secondary | ICD-10-CM | POA: Diagnosis not present

## 2015-09-24 DIAGNOSIS — Z6828 Body mass index (BMI) 28.0-28.9, adult: Secondary | ICD-10-CM | POA: Diagnosis not present

## 2015-10-31 ENCOUNTER — Telehealth: Payer: Self-pay

## 2015-10-31 ENCOUNTER — Other Ambulatory Visit: Payer: Self-pay | Admitting: Gynecologic Oncology

## 2015-10-31 DIAGNOSIS — C541 Malignant neoplasm of endometrium: Secondary | ICD-10-CM | POA: Diagnosis not present

## 2015-10-31 NOTE — Telephone Encounter (Signed)
Patient called with request to have a referral placed to Genetic Counseling , [facility] , APNP updated . Referral for genetic counseling waws placed and the patient was updated . Patient states understanding , is aware that she will be receiving a call , denies further questions at this time.

## 2015-10-31 NOTE — Progress Notes (Signed)
Pt called and requested referral to genetics.

## 2015-11-11 ENCOUNTER — Encounter: Payer: Self-pay | Admitting: Genetic Counselor

## 2015-11-15 MED FILL — OMEGA-3 ETHYL ESTERS 1 GM C: 1 | 90 days supply | Qty: 180 | Fill #0

## 2015-12-28 ENCOUNTER — Encounter: Payer: Self-pay | Admitting: Genetic Counselor

## 2015-12-28 ENCOUNTER — Other Ambulatory Visit: Payer: 59

## 2015-12-28 ENCOUNTER — Ambulatory Visit (HOSPITAL_BASED_OUTPATIENT_CLINIC_OR_DEPARTMENT_OTHER): Payer: 59 | Admitting: Genetic Counselor

## 2015-12-28 DIAGNOSIS — C541 Malignant neoplasm of endometrium: Secondary | ICD-10-CM

## 2015-12-28 DIAGNOSIS — Z315 Encounter for genetic counseling: Secondary | ICD-10-CM | POA: Diagnosis not present

## 2015-12-28 NOTE — Progress Notes (Signed)
REFERRING PROVIDER: Dorothyann Gibbs, NP [place], [place] [address]  PRIMARY PROVIDER:  No primary care provider on file.  PRIMARY REASON FOR VISIT:  1. Endometrial cancer ([place])      HISTORY OF PRESENT ILLNESS:   Ms. Brass, a 60 y.o. female, was seen for a [facility] [place] cancer genetics consultation at the request of Joylene John due to a personal history of cancer.  Ms. Ellerman presents to clinic today to discuss the possibility of a hereditary predisposition to cancer, genetic testing, and to further clarify her future cancer risks, as well as potential cancer risks for family members.   In 2017, at the age of 39, Ms. Pacifico was diagnosed with endometrial cancer. This was treated with complete hysterectomy.  Tumor testing found MSI - H and loss of MLH1 and PMS2.  Further testing found hypermethylation of MLH1, making Lynch syndrome less likely.  Ms. Spargo is adopted with no information about her family history.   CANCER HISTORY:   No history exists.     HORMONAL RISK FACTORS:  Menarche was at age 57.  First live birth at age N/A.  Ovaries intact: no.  Hysterectomy: yes.  Menopausal status: postmenopausal.  HRT use: 0 years. Colonoscopy: yes; normal. Mammogram within the last year: yes. Number of breast biopsies: 0. Up to date with pelvic exams:  yes. Any excessive radiation exposure in the past:  no  Past Medical History  Diagnosis Date  . History of abnormal cervical Pap smear   . PMB (postmenopausal bleeding)   . Thickened endometrium   . Allergy   . Uterine cancer [facility])     Past Surgical History  Procedure Laterality Date  . Cervical cone biopsy    . Hysteroscopy w/d&c N/A 08/24/2015    Procedure: DILATATION AND CURETTAGE /HYSTEROSCOPY;  Surgeon: Maisie Fus, MD;  Location: [facility];  Service: Gynecology;  Laterality: N/A;    Social History   Social History  . Marital Status: Married    Spouse Name: N/A  . Number of Children: N/A   . Years of Education: N/A   Social History Main Topics  . Smoking status: Never Smoker   . Smokeless tobacco: Never Used  . Alcohol Use: 1.2 oz/week    2 Cans of beer per week  . Drug Use: No  . Sexual Activity: Not Asked   Other Topics Concern  . None   Social History Narrative     FAMILY HISTORY:  We obtained a detailed, 4-generation family history.  Significant diagnoses are listed below: Family History  Problem Relation Age of Onset  . Adopted: Yes    No information about the biological family of Ms. Fiallos  GENETIC COUNSELING ASSESSMENT: Manhattan Mccuen is a 60 y.o. female with a personal history of uterine cancer which is somewhat suggestive of a sporadic cancer. We, therefore, discussed and recommended the following at today's visit.   DISCUSSION: We discussed that about 5% of uterine cancer is due to hereditary causes, most commonly due to Lynch syndrome.  We discussed that one way to determine a risk for Lynch syndrome is to perform tumor testing. This type of testing helps identify risk and point in a direction for testing.  Based on tumor testing, Ms. Pellegrin had loss of MLH1 and PMS2 on IHC testing.  When MLH1 is lost on IHC, further testing is necessary in order to determine if the tumor is due to Lynch syndrome, or possibly other exogenous events.  This includes further testing for a BRAF mutation and/or hypermethlyation which can cause loss of MLH1.  Methlyation studies found hypermethlyation, which decreases the likelihood of Lynch syndrome.  We spoke with Ms. Bober that this is reassuring in that the cause of her uterine cancer is most likely due to sporadic causes. Especially in light of a normal colonoscopy with no polyps found. Therefore, it is unlikely that her insurance would cover genetic testing.  Because she is adopted, she is interested in pursuing genetic testing.  We discussed low cost options including [facility] Pilot testing for $250 and [facility] for  $260.  She asked that we do a billing investigation through [facility] before considering OOP options.   In summary, we discussed with Ms. Tapanes that the family history is not highly consistent with a familial hereditary cancer syndrome, and we feel she is at low risk to harbor a gene mutation associated with such a condition. We have asked for a BI to be performed and will get back with her on the results.  PLAN: A billing investigation will be performed.  We will contact Ms. Muhl with these results.  Lastly, we encouraged Ms. Jeng to remain in contact with cancer genetics annually so that we can continuously update the family history and inform her of any changes in cancer genetics and testing that may be of benefit for this family.   Ms.  Schofield questions were answered to her satisfaction today. Our contact information was provided should additional questions or concerns arise. Thank you for the referral and allowing [place] to share in the care of your patient.   Karen P. Florene Glen, [place], [facility] Certified Genetic Counselor [email or web address] phone: [phone]  The patient was seen for a total of 45 minutes in face-to-face genetic counseling.  This patient was discussed with Drs. Magrinat, Lindi Adie and/or Burr Medico who agrees with the above.    _______________________________________________________________________ For Office Staff:  Number of people involved in session: 1 Was an Intern/ student involved with case: yes

## 2016-01-03 ENCOUNTER — Telehealth: Payer: Self-pay | Admitting: Genetic Counselor

## 2016-01-03 NOTE — Telephone Encounter (Signed)
LM on VM that I had information about the billing investigation. Asked that she CB.  Left CB instructions.

## 2016-04-23 ENCOUNTER — Ambulatory Visit: Payer: 59 | Attending: Gynecologic Oncology | Admitting: Gynecologic Oncology

## 2016-04-23 ENCOUNTER — Encounter: Payer: Self-pay | Admitting: Gynecologic Oncology

## 2016-04-23 VITALS — BP 130/83 | HR 66 | Temp 98.0°F | Resp 18 | Wt 169.4 lb

## 2016-04-23 DIAGNOSIS — C541 Malignant neoplasm of endometrium: Secondary | ICD-10-CM | POA: Insufficient documentation

## 2016-04-23 DIAGNOSIS — R938 Abnormal findings on diagnostic imaging of other specified body structures: Secondary | ICD-10-CM | POA: Insufficient documentation

## 2016-04-23 DIAGNOSIS — Z880 Allergy status to penicillin: Secondary | ICD-10-CM | POA: Insufficient documentation

## 2016-04-23 DIAGNOSIS — Z8542 Personal history of malignant neoplasm of other parts of uterus: Secondary | ICD-10-CM | POA: Insufficient documentation

## 2016-04-23 DIAGNOSIS — Z9889 Other specified postprocedural states: Secondary | ICD-10-CM | POA: Insufficient documentation

## 2016-04-23 DIAGNOSIS — E781 Pure hyperglyceridemia: Secondary | ICD-10-CM | POA: Diagnosis not present

## 2016-04-23 DIAGNOSIS — Z90710 Acquired absence of both cervix and uterus: Secondary | ICD-10-CM | POA: Insufficient documentation

## 2016-04-23 DIAGNOSIS — N950 Postmenopausal bleeding: Secondary | ICD-10-CM | POA: Diagnosis not present

## 2016-04-23 DIAGNOSIS — Z79899 Other long term (current) drug therapy: Secondary | ICD-10-CM | POA: Diagnosis not present

## 2016-04-23 NOTE — Progress Notes (Signed)
Consult Note: Gyn-Onc  Consult was requested by Dr. Nori Riis for the evaluation of Wilmon Arms 60 y.o. female  CC:  Chief Complaint  Patient presents with  . endometrial cancer    follow up    Assessment/Plan:  Ms. Safiatou Islam  is a 60 y.o.  year old with a history of stage IA grade 2 endometrial cancer with low risk factors for recurrence and loss of MLH1 and BMS2. Genetics consultation noted hypermethylation of MLH1 and therefore Lynch syndrome testing not recommended.  No evidence of recurrence on today's examination.  Recommend continued 6 monthly pelvic examinations. Pap smear testing not recommended.  Patient will alternate her visits between Dr Nori Riis and myself until April, 2022.    HPI: Julieta Rogalski is a very pleasant G0 woman who is seen in consultation at the request of Dr Nori Riis for grade 2 endometrial cancer.  The patient has a history of post-menopausal bleeding since October 2016.  She underwent US of the pelvis on 08/22/15 with Dr Nori Riis which revealed a uterus measuring 6.9 x 3.3x5.1cm and a thickened endometrial stripe.  A hysteroscopy, D&C was performed on 08/24/15 and pathology revealed FIGO grade 2 endometrial cancer.    She is otherwise very healthy. Postoperative compressive metabolic panel drawn on 36/64/4034 reveals a mildly elevated ALC at 55 (upper limit of normal is 32). AST was also mildly elevated at 37 (upper limit of normal is 32). She subsequently had an abdominal ultrasound scan on 09/16/2015 which revealed a liver with mild increased echogenicity compatible with hepatic steatosis. Otherwise the ultrasound was unremarkable. She has a history of hypertriglyceridemia and takes 1000 mg of fish oil for this but no statins. She's had no prior abdominal surgeries.  She has a remote history of a CKC in 1996 for high grade dysplasia, but normal paps since that time.  Interval Hx: On 09/23/15 she underwent robotic assisted total hysterectomy, BSO and SLN biopsy  with Dr Alycia Rossetti at [facility]. Final pathology revealed a stage IA grade 2 endometrioid endometrial adenocarcinoma with negative right SLN biopsy and negative left pelvic nodes.  Her tumor was tested for MSI and was noted to have deficiciencies in MLH-1 (though with hypermethylation) and BMS2. She was seen by genetics counselors here at [facility] and was determined to be low risk for genetic abnormality and therefore Lynch syndrome testing was negative.   Current Meds:  Outpatient Encounter Prescriptions as of 04/23/2016  Medication Sig  . calcium carbonate (OS-CAL - DOSED IN MG OF ELEMENTAL CALCIUM) 1250 (500 Ca) MG tablet Take 2 tablets by mouth 2 (two) times daily with a meal.  . cetirizine (ZYRTEC) 10 MG tablet Take 10 mg by mouth daily.  . Cholecalciferol (VITAMIN D3) 2000 units TABS Take 2,000 tablets by mouth daily.  Marland Kitchen omega-3 acid ethyl esters (LOVAZA) 1 g capsule    No facility-administered encounter medications on file as of 04/23/2016.     Allergy:  Allergies  Allergen Reactions  . Penicillins Swelling    Rash,swelling on face,upper chest  . Strawberry (Diagnostic) Rash    Social Hx:   Social History   Social History  . Marital status: Married    Spouse name: N/A  . Number of children: N/A  . Years of education: N/A   Occupational History  . Not on file.   Social History Main Topics  . Smoking status: Never Smoker  . Smokeless tobacco: Never Used  . Alcohol use 1.2 oz/week    2  Cans of beer per week  . Drug use: No  . Sexual activity: Not on file   Other Topics Concern  . Not on file   Social History Narrative  . No narrative on file    Past Surgical Hx:  Past Surgical History:  Procedure Laterality Date  . CERVICAL CONE BIOPSY    . HYSTEROSCOPY W/D&C N/A 08/24/2015   Procedure: DILATATION AND CURETTAGE /HYSTEROSCOPY;  Surgeon: Maisie Fus, MD;  Location: [facility];  Service: Gynecology;  Laterality: N/A;    Past  Medical Hx:  Past Medical History:  Diagnosis Date  . Allergy   . History of abnormal cervical Pap smear   . PMB (postmenopausal bleeding)   . Thickened endometrium   . Uterine cancer [facility])     Past Gynecological History:  CKC for dysplasia 1996. G0  No LMP recorded. Patient is postmenopausal.  Family Hx:  Family History  Problem Relation Age of Onset  . Adopted: Yes    Review of Systems:  Constitutional  Feels well,    ENT Normal appearing ears and nares bilaterally Skin/Breast  No rash, sores, jaundice, itching, dryness Cardiovascular  No chest pain, shortness of breath, or edema  Pulmonary  No cough or wheeze.  Gastro Intestinal  No nausea, vomitting, or diarrhoea. No bright red blood per rectum, no abdominal pain, change in bowel movement, or constipation.  Genito Urinary  No frequency, urgency, dysuria, no bleeding Musculo Skeletal  No myalgia, arthralgia, joint swelling or pain  Neurologic  No weakness, numbness, change in gait,  Psychology  No depression, anxiety, insomnia.   Vitals:  Blood pressure 130/83, pulse 66, temperature 98 F (36.7 C), temperature source Oral, resp. rate 18, weight 169 lb 6.4 oz (76.8 kg), SpO2 100 %.  Physical Exam: WD in NAD Neck  Supple NROM, without any enlargements.  Lymph Node Survey No cervical supraclavicular or inguinal adenopathy Cardiovascular  Pulse normal rate, regularity and rhythm. S1 and S2 normal.  Lungs  Clear to auscultation bilateraly, without wheezes/crackles/rhonchi. Good air movement.  Skin  No rash/lesions/breakdown  Psychiatry  Alert and oriented to person, place, and time  Abdomen  Normoactive bowel sounds, abdomen soft, non-tender and slightly overweight without evidence of hernia.Incisions well healed with no nodularity. Back No CVA tenderness Genito Urinary  Vulva/vagina: Normal external female genitalia.  No lesions. No discharge or bleeding.  Bladder/urethra:  No lesions or masses, well  supported bladder  Vagina: normal, no lesions.  Cervix and uterus: surgically absent.  Adnexa: no palpable masses. Rectal  Good tone, no masses no cul de sac nodularity.  Extremities  No bilateral cyanosis, clubbing or edema.   Donaciano Eva, MD  04/23/2016, 1:18 PM

## 2016-04-23 NOTE — Patient Instructions (Signed)
Plan to follow up with Dr. Nori Riis in the Spring of 2018 and Dr. Denman George in the Fall of 2018.  Please call our office once you have seen Dr. Nori Riis to schedule your appointment for November with Dr. Denman George.  Please call for any new symptoms such as vaginal spotting/bleeding, any concerns, or questions.

## 2016-07-12 MED FILL — OMEGA-3 ETHYL ESTERS 1 GM C: 1 | 90 days supply | Qty: 180 | Fill #1

## 2016-07-13 DIAGNOSIS — D2372 Other benign neoplasm of skin of left lower limb, including hip: Secondary | ICD-10-CM | POA: Diagnosis not present

## 2016-07-13 DIAGNOSIS — D224 Melanocytic nevi of scalp and neck: Secondary | ICD-10-CM | POA: Diagnosis not present

## 2016-07-13 DIAGNOSIS — D2262 Melanocytic nevi of left upper limb, including shoulder: Secondary | ICD-10-CM | POA: Diagnosis not present

## 2016-07-13 DIAGNOSIS — D225 Melanocytic nevi of trunk: Secondary | ICD-10-CM | POA: Diagnosis not present

## 2016-07-13 DIAGNOSIS — D2239 Melanocytic nevi of other parts of face: Secondary | ICD-10-CM | POA: Diagnosis not present

## 2016-07-13 DIAGNOSIS — D2261 Melanocytic nevi of right upper limb, including shoulder: Secondary | ICD-10-CM | POA: Diagnosis not present

## 2016-07-13 DIAGNOSIS — D2272 Melanocytic nevi of left lower limb, including hip: Secondary | ICD-10-CM | POA: Diagnosis not present

## 2016-07-13 DIAGNOSIS — L821 Other seborrheic keratosis: Secondary | ICD-10-CM | POA: Diagnosis not present

## 2016-07-13 DIAGNOSIS — L819 Disorder of pigmentation, unspecified: Secondary | ICD-10-CM | POA: Diagnosis not present

## 2016-07-18 ENCOUNTER — Other Ambulatory Visit: Payer: Self-pay | Admitting: Obstetrics & Gynecology

## 2016-07-18 DIAGNOSIS — Z1231 Encounter for screening mammogram for malignant neoplasm of breast: Secondary | ICD-10-CM

## 2016-08-31 ENCOUNTER — Ambulatory Visit
Admission: RE | Admit: 2016-08-31 | Discharge: 2016-08-31 | Disposition: A | Discharge status: Home / Self Care | Payer: 59 | Source: Ambulatory Visit | Attending: Obstetrics & Gynecology | Admitting: Obstetrics & Gynecology

## 2016-08-31 DIAGNOSIS — Z1231 Encounter for screening mammogram for malignant neoplasm of breast: Secondary | ICD-10-CM

## 2016-11-27 DIAGNOSIS — H524 Presbyopia: Secondary | ICD-10-CM | POA: Diagnosis not present

## 2016-11-27 DIAGNOSIS — H5203 Hypermetropia, bilateral: Secondary | ICD-10-CM | POA: Diagnosis not present

## 2016-11-27 DIAGNOSIS — Z130 Encounter for screening for diseases of the blood and blood-forming organs and certain disorders involving the immune mechanism: Secondary | ICD-10-CM | POA: Diagnosis not present

## 2016-11-27 DIAGNOSIS — Z6829 Body mass index (BMI) 29.0-29.9, adult: Secondary | ICD-10-CM | POA: Diagnosis not present

## 2016-11-27 DIAGNOSIS — Z01419 Encounter for gynecological examination (general) (routine) without abnormal findings: Secondary | ICD-10-CM | POA: Diagnosis not present

## 2016-11-27 DIAGNOSIS — Z1329 Encounter for screening for other suspected endocrine disorder: Secondary | ICD-10-CM | POA: Diagnosis not present

## 2016-11-27 DIAGNOSIS — Z13228 Encounter for screening for other metabolic disorders: Secondary | ICD-10-CM | POA: Diagnosis not present

## 2016-11-27 DIAGNOSIS — H52223 Regular astigmatism, bilateral: Secondary | ICD-10-CM | POA: Diagnosis not present

## 2016-11-27 DIAGNOSIS — Z13220 Encounter for screening for lipoid disorders: Secondary | ICD-10-CM | POA: Diagnosis not present

## 2016-11-27 DIAGNOSIS — Z13820 Encounter for screening for osteoporosis: Secondary | ICD-10-CM | POA: Diagnosis not present

## 2016-12-11 ENCOUNTER — Other Ambulatory Visit: Payer: Self-pay | Admitting: Allergy

## 2016-12-11 DIAGNOSIS — R319 Hematuria, unspecified: Secondary | ICD-10-CM | POA: Diagnosis not present

## 2017-04-05 MED FILL — OMEGA-3 ETHYL ESTERS 1 GM C: 1 | 90 days supply | Qty: 180 | Fill #0

## 2017-06-03 ENCOUNTER — Ambulatory Visit: Payer: 59 | Attending: Gynecologic Oncology | Admitting: Gynecologic Oncology

## 2017-06-03 ENCOUNTER — Encounter: Payer: Self-pay | Admitting: Gynecologic Oncology

## 2017-06-03 VITALS — BP 126/76 | HR 69 | Temp 97.7°F | Resp 18 | Ht 64.25 in | Wt 168.9 lb

## 2017-06-03 DIAGNOSIS — Z880 Allergy status to penicillin: Secondary | ICD-10-CM | POA: Insufficient documentation

## 2017-06-03 DIAGNOSIS — Z08 Encounter for follow-up examination after completed treatment for malignant neoplasm: Secondary | ICD-10-CM | POA: Insufficient documentation

## 2017-06-03 DIAGNOSIS — Z91018 Allergy to other foods: Secondary | ICD-10-CM | POA: Insufficient documentation

## 2017-06-03 DIAGNOSIS — Z90710 Acquired absence of both cervix and uterus: Secondary | ICD-10-CM | POA: Insufficient documentation

## 2017-06-03 DIAGNOSIS — E781 Pure hyperglyceridemia: Secondary | ICD-10-CM | POA: Diagnosis not present

## 2017-06-03 DIAGNOSIS — Z90722 Acquired absence of ovaries, bilateral: Secondary | ICD-10-CM | POA: Diagnosis not present

## 2017-06-03 DIAGNOSIS — C541 Malignant neoplasm of endometrium: Secondary | ICD-10-CM

## 2017-06-03 DIAGNOSIS — Z8542 Personal history of malignant neoplasm of other parts of uterus: Secondary | ICD-10-CM | POA: Insufficient documentation

## 2017-06-03 NOTE — Patient Instructions (Signed)
Please notify Dr Denman George at phone number [phone] if you notice vaginal bleeding, new pelvic or abdominal pains, bloating, feeling full easy, or a change in bladder or bowel function.   Please follow-up with Dr Denman George in 6 months.

## 2017-06-03 NOTE — Progress Notes (Signed)
Consult Note: Gyn-Onc  Consult was requested by Dr. Nori Riis for the evaluation of Brooke [name] 61 y.o. [name]  CC:  Chief Complaint  Patient presents with  . Endometrial cancer [facility])    Assessment/Plan:  Brooke [name]  is a 61 y.o.  year old with a history of stage IA grade 2 endometrial cancer with low risk factors for recurrence and loss of MLH1 and BMS2. Genetics consultation noted hypermethylation of MLH1 and therefore Lynch syndrome testing not recommended.  No evidence of recurrence on today's examination.  Recommend continued 6 monthly pelvic examinations until April, 2022.    HPI: Brooke [name] is a very pleasant G0 woman who is seen in consultation at the request of Dr Nori Riis for grade 2 endometrial cancer.  The patient has a history of post-menopausal bleeding since October 2016.  She underwent US of the pelvis on 08/22/15 with Dr Nori Riis which revealed a uterus measuring 6.9 x 3.3x5.1cm and a thickened endometrial stripe.  A hysteroscopy, D&C was performed on 08/24/15 and pathology revealed FIGO grade 2 endometrial cancer.    She is otherwise very healthy. Postoperative compressive metabolic panel drawn on 27/74/1287 reveals a mildly elevated ALC at 55 (upper limit of normal is 32). AST was also mildly elevated at 37 (upper limit of normal is 32). She subsequently had an abdominal ultrasound scan on 09/16/2015 which revealed a liver with mild increased echogenicity compatible with hepatic steatosis. Otherwise the ultrasound was unremarkable. She has a history of hypertriglyceridemia and takes 1000 mg of fish oil for this but no statins. She's had no prior abdominal surgeries.  She has a remote history of a CKC in 1996 for high grade dysplasia, but normal paps since that time.  Interval Hx: On 09/23/15 she underwent robotic assisted total hysterectomy, BSO and SLN biopsy with Dr Alycia Rossetti at [facility]. Final pathology revealed a stage IA grade 2 endometrioid endometrial  adenocarcinoma with negative right SLN biopsy and negative left pelvic nodes.  Her tumor was tested for MSI and was noted to have deficiciencies in MLH-1 (though with hypermethylation) and BMS2. She was seen by genetics counselors here at [facility] and was determined to be low risk for genetic abnormality and therefore Lynch syndrome testing was negative.  She has a new mountain house at [facility].  No complaints or concerns.   Current Meds:  Outpatient Encounter Medications as of 06/03/2017  Medication Sig  . calcium carbonate (OS-CAL - DOSED IN MG OF ELEMENTAL CALCIUM) 1250 (500 Ca) MG tablet Take 2 tablets by mouth 2 (two) times daily with a meal.  . cetirizine (ZYRTEC) 10 MG tablet Take 10 mg by mouth daily.  . Cholecalciferol (VITAMIN D3) 2000 units TABS Take 2,000 tablets by mouth daily.  . Magnesium 250 MG TABS Take 1 tablet by mouth 2 (two) times a week.  . omega-3 acid ethyl esters (LOVAZA) 1 g capsule    No facility-administered encounter medications on file as of 06/03/2017.     Allergy:  Allergies  Allergen Reactions  . Penicillins Swelling    Rash,swelling on face,upper chest  . Strawberry (Diagnostic) Rash    Social Hx:   Social History   Socioeconomic History  . Marital status: Married    Spouse name: Not on file  . Number of children: Not on file  . Years of education: Not on file  . Highest education level: Not on file  Social Needs  . Financial resource strain: Not on file  .  Food insecurity - worry: Not on file  . Food insecurity - inability: Not on file  . Transportation needs - medical: Not on file  . Transportation needs - non-medical: Not on file  Occupational History  . Not on file  Tobacco Use  . Smoking status: Never Smoker  . Smokeless tobacco: Never Used  Substance and Sexual Activity  . Alcohol use: Yes    Alcohol/week: 1.2 oz    Types: 2 Cans of beer per week  . Drug use: No  . Sexual activity: Not on file  Other Topics  Concern  . Not on file  Social History Narrative  . Not on file    Past Surgical Hx:  Past Surgical History:  Procedure Laterality Date  . CERVICAL CONE BIOPSY    . HYSTEROSCOPY W/D&C N/A 08/24/2015   Procedure: DILATATION AND CURETTAGE /HYSTEROSCOPY;  Surgeon: Maisie Fus, MD;  Location: [facility];  Service: Gynecology;  Laterality: N/A;    Past Medical Hx:  Past Medical History:  Diagnosis Date  . Allergy   . History of abnormal cervical Pap smear   . PMB (postmenopausal bleeding)   . Thickened endometrium   . Uterine cancer [facility])     Past Gynecological History:  CKC for dysplasia 1996. G0  No LMP recorded. Patient is postmenopausal.  Family Hx:  Family History  Adopted: Yes    Review of Systems:  Constitutional  Feels well,    ENT Normal appearing ears and nares bilaterally Skin/Breast  No rash, sores, jaundice, itching, dryness Cardiovascular  No chest pain, shortness of breath, or edema  Pulmonary  No cough or wheeze.  Gastro Intestinal  No nausea, vomitting, or diarrhoea. No bright red blood per rectum, no abdominal pain, change in bowel movement, or constipation.  Genito Urinary  No frequency, urgency, dysuria, no bleeding Musculo Skeletal  No myalgia, arthralgia, joint swelling or pain  Neurologic  No weakness, numbness, change in gait,  Psychology  No depression, anxiety, insomnia.   Vitals:  Blood pressure 126/76, pulse 69, temperature 97.7 F (36.5 C), temperature source Oral, resp. rate 18, height 5' 4.25" (1.632 m), weight 168 lb 14.4 oz (76.6 kg), SpO2 100 %.  Physical Exam: WD in NAD Neck  Supple NROM, without any enlargements.  Lymph Node Survey No cervical supraclavicular or inguinal adenopathy Cardiovascular  Pulse normal rate, regularity and rhythm. S1 and S2 normal.  Lungs  Clear to auscultation bilateraly, without wheezes/crackles/rhonchi. Good air movement.  Skin  No rash/lesions/breakdown  Psychiatry  Alert  and oriented to person, place, and time  Abdomen  Normoactive bowel sounds, abdomen soft, non-tender and slightly overweight without evidence of hernia.Incisions well healed with no nodularity. Back No CVA tenderness Genito Urinary  Vulva/vagina: Normal external [name] genitalia.  No lesions. No discharge or bleeding.  Bladder/urethra:  No lesions or masses, well supported bladder  Vagina: normal, no lesions.  Cervix and uterus: surgically absent.  Adnexa: no palpable masses. Rectal  Good tone, no masses no cul de sac nodularity.  Extremities  No bilateral cyanosis, clubbing or edema.   Donaciano Eva, MD  06/03/2017, 3:48 PM

## 2017-06-17 IMAGING — US US ABDOMEN COMPLETE
1 series · 14 of 25 positions shown · non-contrast
Comparison: None.

CLINICAL DATA: Elevated LFTs

EXAM:
ABDOMEN ULTRASOUND COMPLETE

[Series 1: us abdomen complete · 0.22mm/px · 14 of 112 slices shown]
[im 1/112]
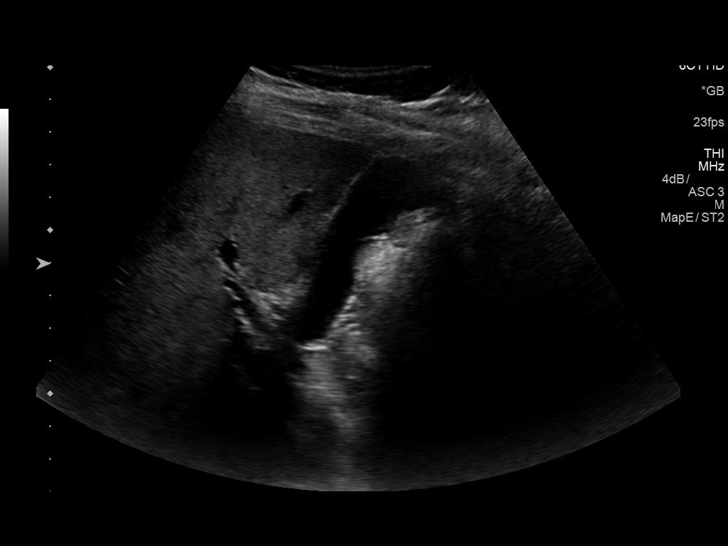
[im 10/112]
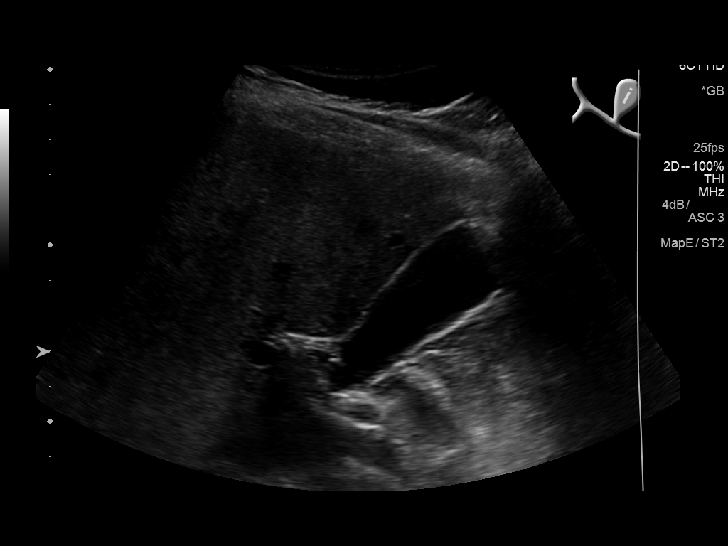
[im 19/112]
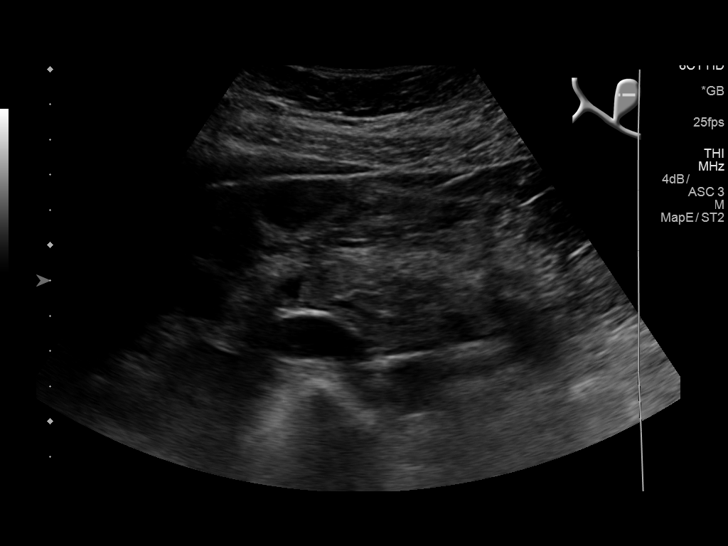
[im 28/112]
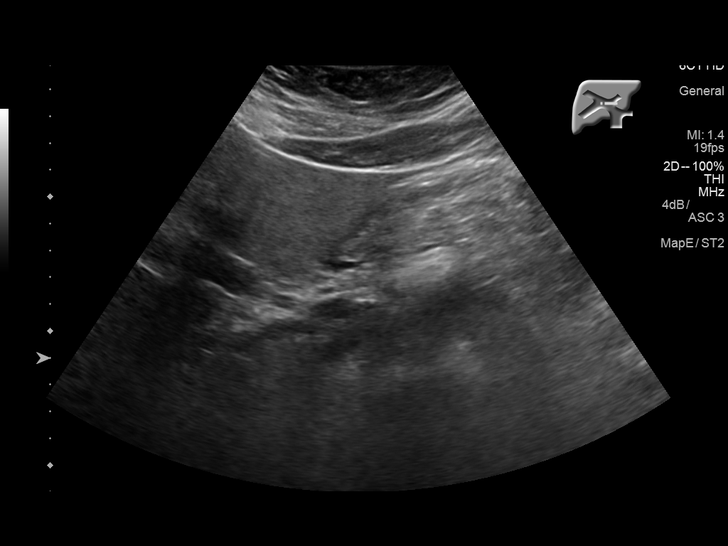
[im 38/112]
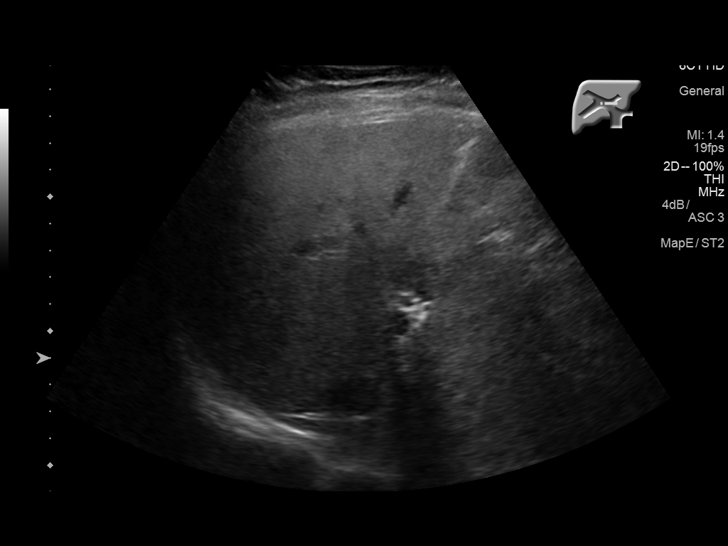
[im 42/112]
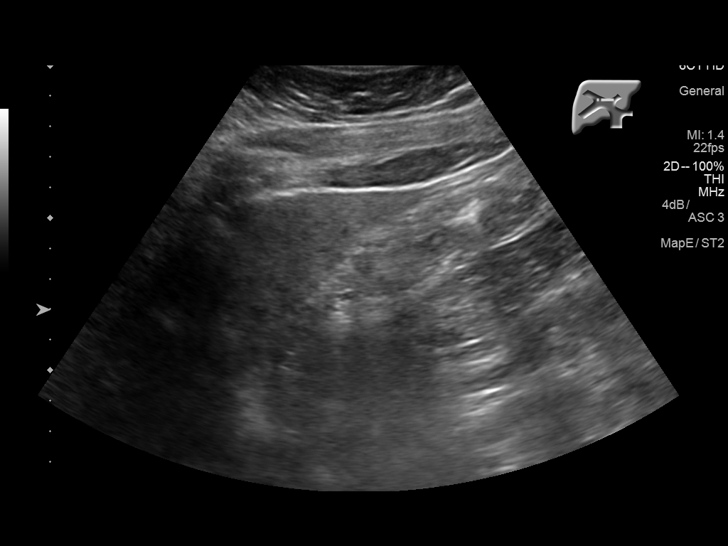
[im 51/112]
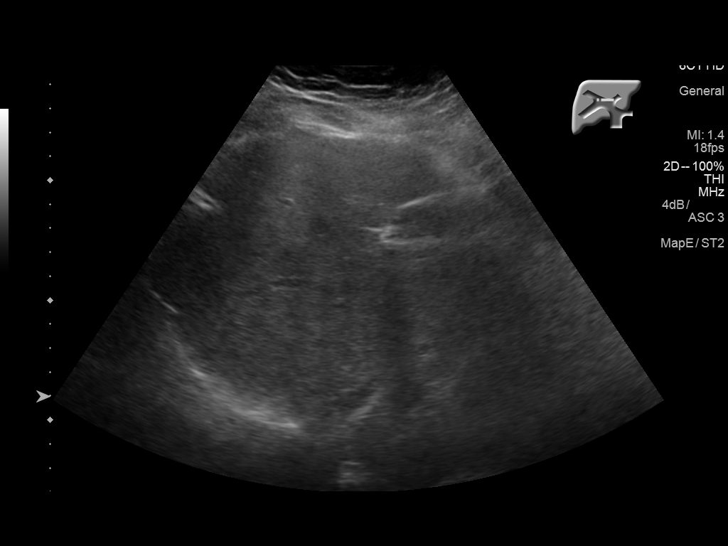
[im 61/112]
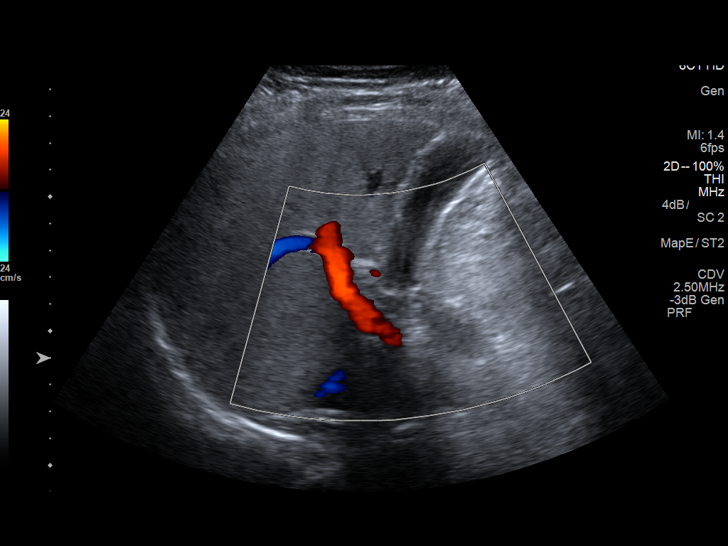
[im 70/112]
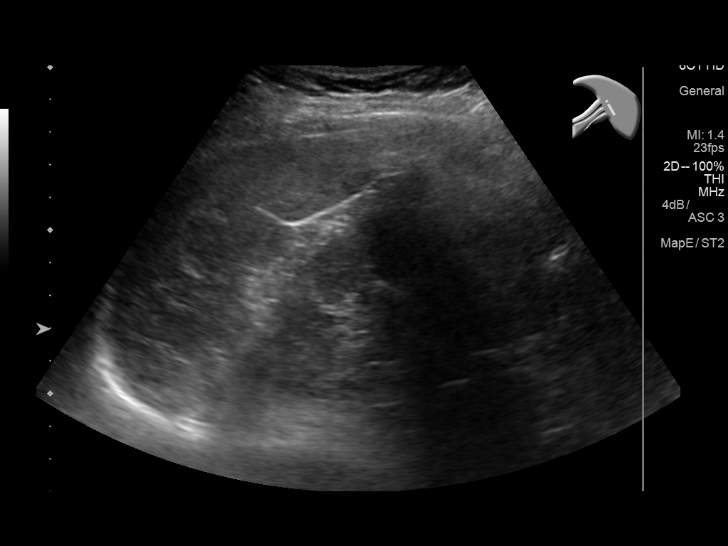
[im 75/112]
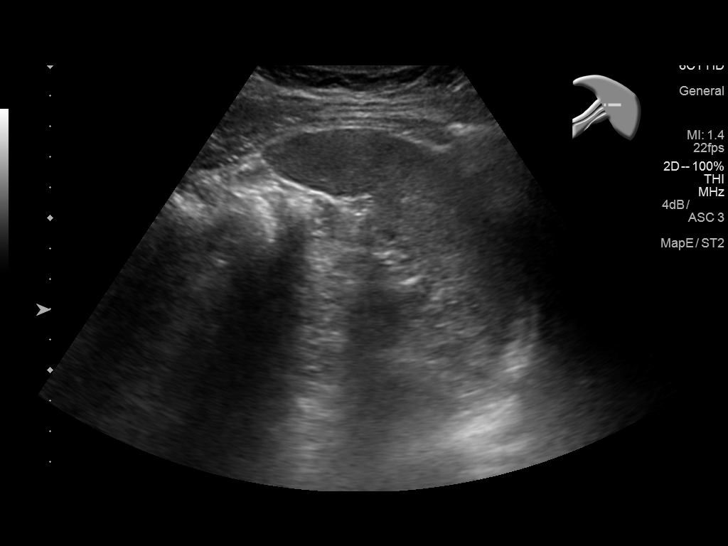
[im 84/112]
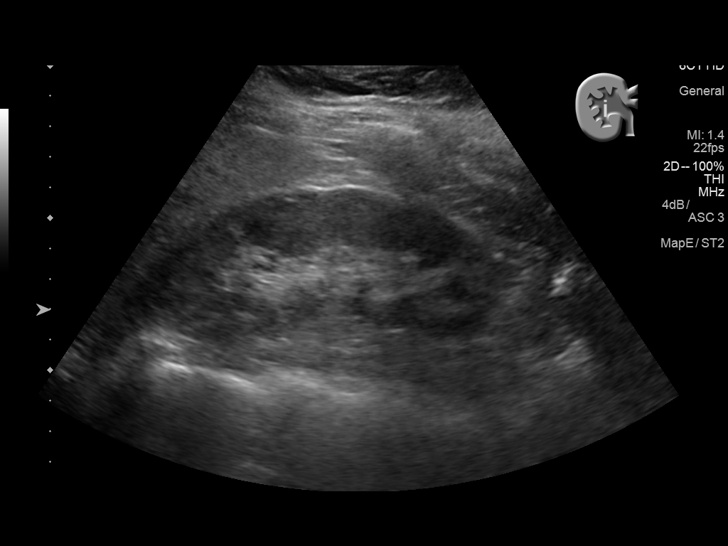
[im 93/112]
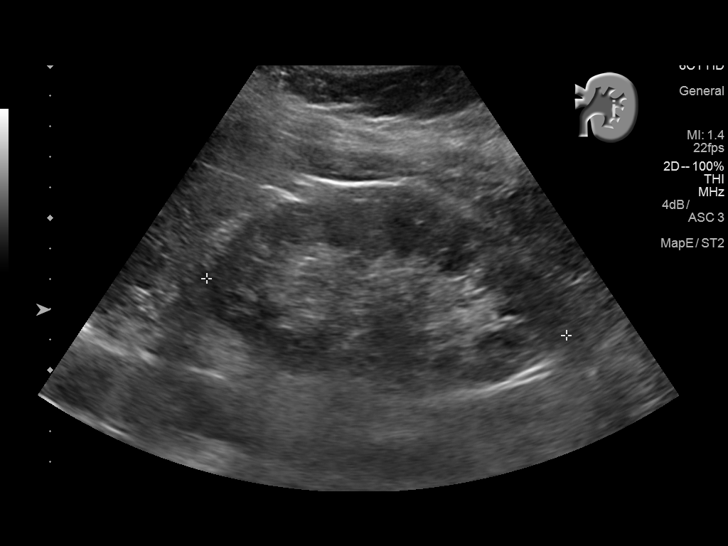
[im 102/112]
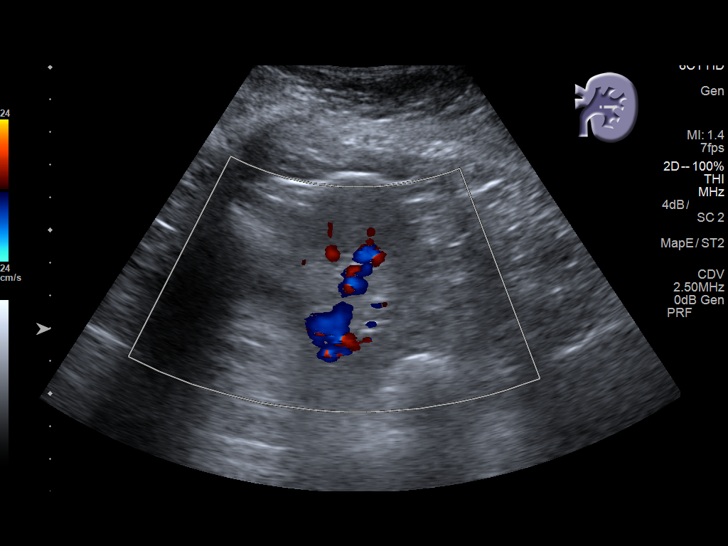
[im 112/112]
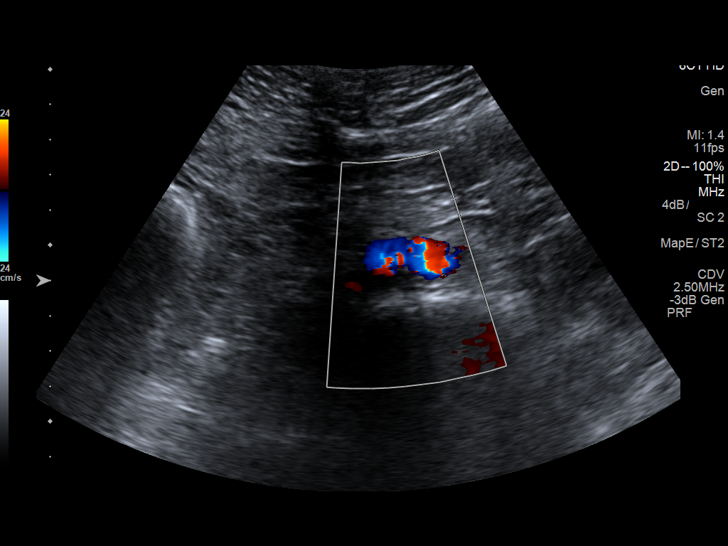

[14 of 25 positions shown; findings below may reference images not displayed]

FINDINGS: Gallbladder: No gallstones or wall thickening visualized. No
sonographic Murphy sign noted by sonographer.

Common bile duct: Diameter: 6.8 mm

Liver: Mild increased echogenicity compatible with hepatic
steatosis. No focal hepatic abnormality or intrahepatic biliary
dilatation. Patent hepatic and portal veins with normal directional
flow.

IVC: No abnormality visualized.

Pancreas: Visualized portion unremarkable.

Spleen: Size and appearance within normal limits.

Right Kidney: Length: 11.8 cm. Echogenicity within normal limits. No
mass or hydronephrosis visualized.

Left Kidney: Length: 12.0 cm. Echogenicity within normal limits. No
mass or hydronephrosis visualized.

Abdominal aorta: Minor atherosclerotic change. 2.6 cm maximal
diameter. No aneurysm.

Other findings: None.
IMPRESSION: Hepatic steatosis.

No other acute process by ultrasound.

## 2017-07-10 DIAGNOSIS — L738 Other specified follicular disorders: Secondary | ICD-10-CM | POA: Diagnosis not present

## 2017-07-10 MED FILL — DOXYCYCLINE HYCLATE 100 MG: 100 | 14 days supply | Qty: 28 | Fill #0

## 2017-08-21 DIAGNOSIS — L918 Other hypertrophic disorders of the skin: Secondary | ICD-10-CM | POA: Diagnosis not present

## 2017-08-21 DIAGNOSIS — D2272 Melanocytic nevi of left lower limb, including hip: Secondary | ICD-10-CM | POA: Diagnosis not present

## 2017-08-21 DIAGNOSIS — L814 Other melanin hyperpigmentation: Secondary | ICD-10-CM | POA: Diagnosis not present

## 2017-08-21 DIAGNOSIS — D2262 Melanocytic nevi of left upper limb, including shoulder: Secondary | ICD-10-CM | POA: Diagnosis not present

## 2017-08-21 DIAGNOSIS — L821 Other seborrheic keratosis: Secondary | ICD-10-CM | POA: Diagnosis not present

## 2017-08-21 DIAGNOSIS — D224 Melanocytic nevi of scalp and neck: Secondary | ICD-10-CM | POA: Diagnosis not present

## 2017-08-21 DIAGNOSIS — L820 Inflamed seborrheic keratosis: Secondary | ICD-10-CM | POA: Diagnosis not present

## 2017-08-21 DIAGNOSIS — D225 Melanocytic nevi of trunk: Secondary | ICD-10-CM | POA: Diagnosis not present

## 2017-08-21 DIAGNOSIS — D2372 Other benign neoplasm of skin of left lower limb, including hip: Secondary | ICD-10-CM | POA: Diagnosis not present

## 2017-09-19 ENCOUNTER — Other Ambulatory Visit: Payer: Self-pay | Admitting: Internal Medicine

## 2017-09-19 DIAGNOSIS — Z139 Encounter for screening, unspecified: Secondary | ICD-10-CM

## 2017-10-10 ENCOUNTER — Ambulatory Visit
Admission: RE | Admit: 2017-10-10 | Discharge: 2017-10-10 | Disposition: A | Discharge status: Home / Self Care | Payer: 59 | Source: Ambulatory Visit | Attending: Internal Medicine | Admitting: Internal Medicine

## 2017-10-10 DIAGNOSIS — Z1231 Encounter for screening mammogram for malignant neoplasm of breast: Secondary | ICD-10-CM | POA: Diagnosis not present

## 2017-10-10 DIAGNOSIS — Z139 Encounter for screening, unspecified: Secondary | ICD-10-CM

## 2017-12-05 DIAGNOSIS — Z0000 Encounter for general adult medical examination without abnormal findings: Secondary | ICD-10-CM | POA: Diagnosis not present

## 2017-12-05 DIAGNOSIS — E559 Vitamin D deficiency, unspecified: Secondary | ICD-10-CM | POA: Diagnosis not present

## 2017-12-06 ENCOUNTER — Inpatient Hospital Stay: Payer: 59 | Attending: Gynecologic Oncology | Admitting: Gynecologic Oncology

## 2017-12-06 ENCOUNTER — Encounter: Payer: Self-pay | Admitting: Gynecologic Oncology

## 2017-12-06 VITALS — BP 125/86 | HR 72 | Temp 97.8°F | Resp 20 | Ht 65.0 in | Wt 170.1 lb

## 2017-12-06 DIAGNOSIS — Z8542 Personal history of malignant neoplasm of other parts of uterus: Secondary | ICD-10-CM | POA: Insufficient documentation

## 2017-12-06 DIAGNOSIS — C541 Malignant neoplasm of endometrium: Secondary | ICD-10-CM

## 2017-12-06 DIAGNOSIS — Z90722 Acquired absence of ovaries, bilateral: Secondary | ICD-10-CM | POA: Diagnosis not present

## 2017-12-06 DIAGNOSIS — Z90710 Acquired absence of both cervix and uterus: Secondary | ICD-10-CM | POA: Insufficient documentation

## 2017-12-06 NOTE — Progress Notes (Signed)
Consult Note: Gyn-Onc  Consult was requested by Dr. Nori Riis for the evaluation of Brooke [name] 62 y.o. female  CC:  Chief Complaint  Patient presents with  . Endometrial cancer [facility])    Assessment/Plan:  Brooke [name]  is a 62 y.o.  year old with a history of stage IA grade 2 endometrial cancer with low risk factors for recurrence and loss of MLH1 and BMS2. Genetics consultation noted hypermethylation of MLH1 and therefore Lynch syndrome testing not recommended.  No evidence of recurrence on today's examination.  Recommend continued 6 monthly pelvic examinations until April, 2022.    HPI: Brooke [name] is a very pleasant G0 woman who is seen in consultation at the request of Dr Nori Riis for grade 2 endometrial cancer.  The patient has a history of post-menopausal bleeding since October 2016.  She underwent US of the pelvis on 08/22/15 with Dr Nori Riis which revealed a uterus measuring 6.9 x 3.3x5.1cm and a thickened endometrial stripe.  A hysteroscopy, D&C was performed on 08/24/15 and pathology revealed FIGO grade 2 endometrial cancer.    She is otherwise very healthy. Postoperative compressive metabolic panel drawn on 13/01/6577 reveals a mildly elevated ALC at 55 (upper limit of normal is 32). AST was also mildly elevated at 37 (upper limit of normal is 32). She subsequently had an abdominal ultrasound scan on 09/16/2015 which revealed a liver with mild increased echogenicity compatible with hepatic steatosis. Otherwise the ultrasound was unremarkable. She has a history of hypertriglyceridemia and takes 1000 mg of fish oil for this but no statins. She's had no prior abdominal surgeries.  She has a remote history of a CKC in 1996 for high grade dysplasia, but normal paps since that time.  Interval Hx: On 09/23/15 she underwent robotic assisted total hysterectomy, BSO and SLN biopsy with Dr Alycia Rossetti at [facility]. Final pathology revealed a stage IA grade 2 endometrioid endometrial  adenocarcinoma with negative right SLN biopsy and negative left pelvic nodes.  Her tumor was tested for MSI and was noted to have deficiciencies in MLH-1 (though with hypermethylation) and BMS2. She was seen by genetics counselors here at [facility] and was determined to be low risk for genetic abnormality and therefore Lynch syndrome testing was negative.  She has a new mountain house at [facility].  No complaints or concerns.   Current Meds:  Outpatient Encounter Medications as of 12/06/2017  Medication Sig  . calcium carbonate (OS-CAL - DOSED IN MG OF ELEMENTAL CALCIUM) 1250 (500 Ca) MG tablet Take 2 tablets by mouth 2 (two) times daily with a meal.  . cetirizine (ZYRTEC) 10 MG tablet Take 10 mg by mouth daily.  . Cholecalciferol (VITAMIN D3) 2000 units TABS Take 2,000 tablets by mouth daily.  . Magnesium 250 MG TABS Take 1 tablet by mouth 2 (two) times a week.  . omega-3 acid ethyl esters (LOVAZA) 1 g capsule daily.    No facility-administered encounter medications on file as of 12/06/2017.     Allergy:  Allergies  Allergen Reactions  . Penicillins Swelling    Rash,swelling on face,upper chest  . Strawberry (Diagnostic) Rash    Social Hx:   Social History   Socioeconomic History  . Marital status: Married    Spouse name: Not on file  . Number of children: Not on file  . Years of education: Not on file  . Highest education level: Not on file  Occupational History  . Not on file  Social Needs  .  Financial resource strain: Not on file  . Food insecurity:    Worry: Not on file    Inability: Not on file  . Transportation needs:    Medical: Not on file    Non-medical: Not on file  Tobacco Use  . Smoking status: Never Smoker  . Smokeless tobacco: Never Used  Substance and Sexual Activity  . Alcohol use: Yes    Alcohol/week: 1.2 oz    Types: 2 Cans of beer per week  . Drug use: No  . Sexual activity: Not on file  Lifestyle  . Physical activity:    Days  per week: Not on file    Minutes per session: Not on file  . Stress: Not on file  Relationships  . Social connections:    Talks on phone: Not on file    Gets together: Not on file    Attends religious service: Not on file    Active member of club or organization: Not on file    Attends meetings of clubs or organizations: Not on file    Relationship status: Not on file  . Intimate partner violence:    Fear of current or ex partner: Not on file    Emotionally abused: Not on file    Physically abused: Not on file    Forced sexual activity: Not on file  Other Topics Concern  . Not on file  Social History Narrative  . Not on file    Past Surgical Hx:  Past Surgical History:  Procedure Laterality Date  . CERVICAL CONE BIOPSY    . HYSTEROSCOPY W/D&C N/A 08/24/2015   Procedure: DILATATION AND CURETTAGE /HYSTEROSCOPY;  Surgeon: Maisie Fus, MD;  Location: [facility];  Service: Gynecology;  Laterality: N/A;    Past Medical Hx:  Past Medical History:  Diagnosis Date  . Allergy   . History of abnormal cervical Pap smear   . PMB (postmenopausal bleeding)   . Thickened endometrium   . Uterine cancer [facility])     Past Gynecological History:  CKC for dysplasia 1996. G0  No LMP recorded. Patient is postmenopausal.  Family Hx:  Family History  Adopted: Yes    Review of Systems:  Constitutional  Feels well,    ENT Normal appearing ears and nares bilaterally Skin/Breast  No rash, sores, jaundice, itching, dryness Cardiovascular  No chest pain, shortness of breath, or edema  Pulmonary  No cough or wheeze.  Gastro Intestinal  No nausea, vomitting, or diarrhoea. No bright red blood per rectum, no abdominal pain, change in bowel movement, or constipation.  Genito Urinary  No frequency, urgency, dysuria, no bleeding Musculo Skeletal  No myalgia, arthralgia, joint swelling or pain  Neurologic  No weakness, numbness, change in gait,  Psychology  No depression,  anxiety, insomnia.   Vitals:  Blood pressure 125/86, pulse 72, temperature 97.8 F (36.6 C), temperature source Oral, resp. rate 20, height 5' 5" (1.651 m), weight 170 lb 1.6 oz (77.2 kg), SpO2 98 %.  Physical Exam: WD in NAD Neck  Supple NROM, without any enlargements.  Lymph Node Survey No cervical supraclavicular or inguinal adenopathy Cardiovascular  Pulse normal rate, regularity and rhythm. S1 and S2 normal.  Lungs  Clear to auscultation bilateraly, without wheezes/crackles/rhonchi. Good air movement.  Skin  No rash/lesions/breakdown  Psychiatry  Alert and oriented to person, place, and time  Abdomen  Normoactive bowel sounds, abdomen soft, non-tender and slightly overweight without evidence of hernia.Incisions well healed with no nodularity. Back  No CVA tenderness Genito Urinary  Vulva/vagina: Normal external female genitalia.  No lesions. No discharge or bleeding.  Bladder/urethra:  No lesions or masses, well supported bladder  Vagina: normal, no lesions.  Cervix and uterus: surgically absent.  Adnexa: no palpable masses. Rectal  Good tone, no masses no cul de sac nodularity.  Extremities  No bilateral cyanosis, clubbing or edema.   Emma C Rossi, MD  12/06/2017, 2:32 PM       

## 2017-12-06 NOTE — Patient Instructions (Signed)
Please notify Dr Denman George at phone number [phone] if you notice vaginal bleeding, new pelvic or abdominal pains, bloating, feeling full easy, or a change in bladder or bowel function.   Please return to see Dr Denman George in December, 2019.

## 2017-12-12 DIAGNOSIS — Z0000 Encounter for general adult medical examination without abnormal findings: Secondary | ICD-10-CM | POA: Diagnosis not present

## 2017-12-12 DIAGNOSIS — R3121 Asymptomatic microscopic hematuria: Secondary | ICD-10-CM | POA: Diagnosis not present

## 2017-12-12 DIAGNOSIS — E7800 Pure hypercholesterolemia, unspecified: Secondary | ICD-10-CM | POA: Diagnosis not present

## 2017-12-12 DIAGNOSIS — Z23 Encounter for immunization: Secondary | ICD-10-CM | POA: Diagnosis not present

## 2017-12-23 MED FILL — OMEGA-3 ETHYL ESTERS 1 GM C: 1 | 90 days supply | Qty: 180 | Fill #1

## 2018-03-17 ENCOUNTER — Telehealth: Payer: Self-pay | Admitting: *Deleted

## 2018-03-17 NOTE — Telephone Encounter (Signed)
Called and left the patient the a message to call the office back. Need to move her appt on 12/27 from 9am to 10am.

## 2018-03-17 NOTE — Telephone Encounter (Signed)
Patient called back and moved her appt from 12/27 to 12/13

## 2018-05-12 DIAGNOSIS — Z01818 Encounter for other preprocedural examination: Secondary | ICD-10-CM | POA: Diagnosis not present

## 2018-05-12 DIAGNOSIS — Z1210 Encounter for screening for malignant neoplasm of intestinal tract, unspecified: Secondary | ICD-10-CM | POA: Diagnosis not present

## 2018-05-28 MED FILL — PEG-3350 SOLUTION: 420 | 1 days supply | Qty: 4000 | Fill #0

## 2018-05-30 ENCOUNTER — Inpatient Hospital Stay: Payer: 59 | Attending: Gynecologic Oncology | Admitting: Gynecologic Oncology

## 2018-05-30 ENCOUNTER — Encounter: Payer: Self-pay | Admitting: Gynecologic Oncology

## 2018-05-30 VITALS — BP 133/86 | HR 70 | Temp 97.7°F | Resp 16 | Ht 65.0 in | Wt 171.0 lb

## 2018-05-30 DIAGNOSIS — C541 Malignant neoplasm of endometrium: Secondary | ICD-10-CM

## 2018-05-30 DIAGNOSIS — Z90710 Acquired absence of both cervix and uterus: Secondary | ICD-10-CM | POA: Insufficient documentation

## 2018-05-30 DIAGNOSIS — Z8542 Personal history of malignant neoplasm of other parts of uterus: Secondary | ICD-10-CM | POA: Diagnosis not present

## 2018-05-30 DIAGNOSIS — Z90722 Acquired absence of ovaries, bilateral: Secondary | ICD-10-CM | POA: Insufficient documentation

## 2018-05-30 NOTE — Patient Instructions (Signed)
Please notify Dr Rhyland Hinderliter at phone number [phone] if you notice vaginal bleeding, new pelvic or abdominal pains, bloating, feeling full easy, or a change in bladder or bowel function.  Please return to see Dr Osceola Depaz in 6 months.  

## 2018-05-30 NOTE — Progress Notes (Signed)
Consult Note: Gyn-Onc  Consult was requested by Dr. Nori Riis for the evaluation of Brooke [name] 62 y.o. female  CC:  Chief Complaint  Patient presents with  . endometrial cancer    follow-up    Assessment/Plan:  Ms. Brooke [name]  is a 62 y.o.  year old with a history of stage IA grade 2 endometrial cancer with low risk factors for recurrence and loss of MLH1 and BMS2. Genetics consultation noted hypermethylation of MLH1 and therefore Lynch syndrome testing not recommended.  No evidence of recurrence on today's examination.  Recommend continued 6 monthly pelvic examinations until April, 2022.    HPI: Brooke [name] is a very pleasant G0 woman who is seen in consultation at the request of Dr Nori Riis for grade 2 endometrial cancer.  The patient has a history of post-menopausal bleeding since October 2016.  She underwent US of the pelvis on 08/22/15 with Dr Nori Riis which revealed a uterus measuring 6.9 x 3.3x5.1cm and a thickened endometrial stripe.  A hysteroscopy, D&C was performed on 08/24/15 and pathology revealed FIGO grade 2 endometrial cancer.    She is otherwise very healthy. Postoperative compressive metabolic panel drawn on 36/64/4034 reveals a mildly elevated ALC at 55 (upper limit of normal is 32). AST was also mildly elevated at 37 (upper limit of normal is 32). She subsequently had an abdominal ultrasound scan on 09/16/2015 which revealed a liver with mild increased echogenicity compatible with hepatic steatosis. Otherwise the ultrasound was unremarkable. She has a history of hypertriglyceridemia and takes 1000 mg of fish oil for this but no statins. She's had no prior abdominal surgeries.  She has a remote history of a CKC in 1996 for high grade dysplasia, but normal paps since that time.  Interval Hx: On 09/23/15 she underwent robotic assisted total hysterectomy, BSO and SLN biopsy with Dr Alycia Rossetti at [facility]. Final pathology revealed a stage IA grade 2 endometrioid  endometrial adenocarcinoma with negative right SLN biopsy and negative left pelvic nodes.  Her tumor was tested for MSI and was noted to have deficiciencies in MLH-1 (though with hypermethylation) and BMS2. She was seen by genetics counselors here at [facility] and was determined to be low risk for genetic abnormality and therefore Lynch syndrome testing was negative.  She has a mountain house at [facility]  No complaints or concerns.   Current Meds:  Outpatient Encounter Medications as of 05/30/2018  Medication Sig  . calcium carbonate (OS-CAL - DOSED IN MG OF ELEMENTAL CALCIUM) 1250 (500 Ca) MG tablet Take 1 tablet by mouth 2 (two) times daily with a meal. Switched to Slow Cal- takes one a day  . cetirizine (ZYRTEC) 10 MG tablet Take 10 mg by mouth daily.  . Cholecalciferol (VITAMIN D3) 2000 units TABS Take 1 tablet by mouth daily.   . Magnesium 250 MG TABS Take 1 tablet by mouth 2 (two) times a week.  . omega-3 acid ethyl esters (LOVAZA) 1 g capsule daily.    No facility-administered encounter medications on file as of 05/30/2018.     Allergy:  Allergies  Allergen Reactions  . Penicillins Swelling    Rash,swelling on face,upper chest  . Strawberry (Diagnostic) Rash    Social Hx:   Social History   Socioeconomic History  . Marital status: Married    Spouse name: Not on file  . Number of children: Not on file  . Years of education: Not on file  . Highest education level: Not on file  Occupational History  . Not on file  Social Needs  . Financial resource strain: Not on file  . Food insecurity:    Worry: Not on file    Inability: Not on file  . Transportation needs:    Medical: Not on file    Non-medical: Not on file  Tobacco Use  . Smoking status: Never Smoker  . Smokeless tobacco: Never Used  Substance and Sexual Activity  . Alcohol use: Yes    Alcohol/week: 2.0 standard drinks    Types: 2 Cans of beer per week  . Drug use: No  . Sexual activity:  Not on file  Lifestyle  . Physical activity:    Days per week: Not on file    Minutes per session: Not on file  . Stress: Not on file  Relationships  . Social connections:    Talks on phone: Not on file    Gets together: Not on file    Attends religious service: Not on file    Active member of club or organization: Not on file    Attends meetings of clubs or organizations: Not on file    Relationship status: Not on file  . Intimate partner violence:    Fear of current or ex partner: Not on file    Emotionally abused: Not on file    Physically abused: Not on file    Forced sexual activity: Not on file  Other Topics Concern  . Not on file  Social History Narrative  . Not on file    Past Surgical Hx:  Past Surgical History:  Procedure Laterality Date  . CERVICAL CONE BIOPSY    . HYSTEROSCOPY W/D&C N/A 08/24/2015   Procedure: DILATATION AND CURETTAGE /HYSTEROSCOPY;  Surgeon: Maisie Fus, MD;  Location: [facility];  Service: Gynecology;  Laterality: N/A;    Past Medical Hx:  Past Medical History:  Diagnosis Date  . Allergy   . History of abnormal cervical Pap smear   . PMB (postmenopausal bleeding)   . Thickened endometrium   . Uterine cancer [facility])     Past Gynecological History:  CKC for dysplasia 1996. G0  No LMP recorded. Patient is postmenopausal.  Family Hx:  Family History  Adopted: Yes    Review of Systems:  Constitutional  Feels well,    ENT Normal appearing ears and nares bilaterally Skin/Breast  No rash, sores, jaundice, itching, dryness Cardiovascular  No chest pain, shortness of breath, or edema  Pulmonary  No cough or wheeze.  Gastro Intestinal  No nausea, vomitting, or diarrhoea. No bright red blood per rectum, no abdominal pain, change in bowel movement, or constipation.  Genito Urinary  No frequency, urgency, dysuria, no bleeding Musculo Skeletal  No myalgia, arthralgia, joint swelling or pain  Neurologic  No weakness,  numbness, change in gait,  Psychology  No depression, anxiety, insomnia.   Vitals:  Blood pressure 133/86, pulse 70, temperature 97.7 F (36.5 C), temperature source Oral, resp. rate 16, height 5' 5"  (1.651 m), weight 171 lb (77.6 kg), SpO2 100 %.  Physical Exam: WD in NAD Neck  Supple NROM, without any enlargements.  Lymph Node Survey No cervical supraclavicular or inguinal adenopathy Cardiovascular  Pulse normal rate, regularity and rhythm. S1 and S2 normal.  Lungs  Clear to auscultation bilateraly, without wheezes/crackles/rhonchi. Good air movement.  Skin  No rash/lesions/breakdown  Psychiatry  Alert and oriented to person, place, and time  Abdomen  Normoactive bowel sounds, abdomen soft, non-tender and slightly  overweight without evidence of hernia.Incisions well healed with no nodularity. Back No CVA tenderness Genito Urinary  Vulva/vagina: Normal external female genitalia.  No lesions. No discharge or bleeding.  Bladder/urethra:  No lesions or masses, well supported bladder  Vagina: normal, no lesions.  Cervix and uterus: surgically absent.  Adnexa: no palpable masses. Rectal  Good tone, no masses no cul de sac nodularity.  Extremities  No bilateral cyanosis, clubbing or edema.   Thereasa Solo, MD  05/30/2018, 9:50 AM

## 2018-06-02 DIAGNOSIS — K5730 Diverticulosis of large intestine without perforation or abscess without bleeding: Secondary | ICD-10-CM | POA: Diagnosis not present

## 2018-06-02 DIAGNOSIS — Z1211 Encounter for screening for malignant neoplasm of colon: Secondary | ICD-10-CM | POA: Diagnosis not present

## 2018-06-02 DIAGNOSIS — D122 Benign neoplasm of ascending colon: Secondary | ICD-10-CM | POA: Diagnosis not present

## 2018-06-13 ENCOUNTER — Ambulatory Visit: Payer: 59 | Admitting: Gynecologic Oncology

## 2018-09-13 MED FILL — OMEGA-3 ETHYL ESTERS 1 GM C: 1 | 90 days supply | Qty: 180 | Fill #0

## 2018-10-09 DIAGNOSIS — D235 Other benign neoplasm of skin of trunk: Secondary | ICD-10-CM | POA: Diagnosis not present

## 2018-10-09 DIAGNOSIS — D485 Neoplasm of uncertain behavior of skin: Secondary | ICD-10-CM | POA: Diagnosis not present

## 2018-10-09 DIAGNOSIS — D1801 Hemangioma of skin and subcutaneous tissue: Secondary | ICD-10-CM | POA: Diagnosis not present

## 2018-10-09 DIAGNOSIS — L814 Other melanin hyperpigmentation: Secondary | ICD-10-CM | POA: Diagnosis not present

## 2018-10-09 DIAGNOSIS — C44311 Basal cell carcinoma of skin of nose: Secondary | ICD-10-CM | POA: Diagnosis not present

## 2018-10-09 DIAGNOSIS — D2372 Other benign neoplasm of skin of left lower limb, including hip: Secondary | ICD-10-CM | POA: Diagnosis not present

## 2018-10-09 DIAGNOSIS — D2262 Melanocytic nevi of left upper limb, including shoulder: Secondary | ICD-10-CM | POA: Diagnosis not present

## 2018-10-09 DIAGNOSIS — L821 Other seborrheic keratosis: Secondary | ICD-10-CM | POA: Diagnosis not present

## 2018-10-09 DIAGNOSIS — D2261 Melanocytic nevi of right upper limb, including shoulder: Secondary | ICD-10-CM | POA: Diagnosis not present

## 2018-10-09 DIAGNOSIS — L918 Other hypertrophic disorders of the skin: Secondary | ICD-10-CM | POA: Diagnosis not present

## 2018-10-15 DIAGNOSIS — L239 Allergic contact dermatitis, unspecified cause: Secondary | ICD-10-CM | POA: Diagnosis not present

## 2018-10-15 MED FILL — TRIAMCINOLONE 0.1% CREAM: 0.1 | 14 days supply | Qty: 30 | Fill #0

## 2018-10-15 MED FILL — predniSONE 10 MG TABS: 10 | 12 days supply | Qty: 42 | Fill #0

## 2018-10-15 MED FILL — hydrOXYzine HCL 25 MG TABS: 25 | 40 days supply | Qty: 40 | Fill #0

## 2018-11-05 DIAGNOSIS — Z85828 Personal history of other malignant neoplasm of skin: Secondary | ICD-10-CM | POA: Diagnosis not present

## 2018-11-05 DIAGNOSIS — C44311 Basal cell carcinoma of skin of nose: Secondary | ICD-10-CM | POA: Diagnosis not present

## 2018-11-05 MED FILL — DOXYCYCLINE HYCLATE 100 MG: 100 | 5 days supply | Qty: 10 | Fill #0

## 2018-11-18 DIAGNOSIS — H52223 Regular astigmatism, bilateral: Secondary | ICD-10-CM | POA: Diagnosis not present

## 2018-11-18 DIAGNOSIS — H5203 Hypermetropia, bilateral: Secondary | ICD-10-CM | POA: Diagnosis not present

## 2018-11-18 DIAGNOSIS — H524 Presbyopia: Secondary | ICD-10-CM | POA: Diagnosis not present

## 2018-12-01 ENCOUNTER — Other Ambulatory Visit: Payer: Self-pay

## 2018-12-01 ENCOUNTER — Encounter: Payer: Self-pay | Admitting: Gynecologic Oncology

## 2018-12-01 ENCOUNTER — Inpatient Hospital Stay: Payer: 59 | Attending: Gynecologic Oncology | Admitting: Gynecologic Oncology

## 2018-12-01 VITALS — BP 141/98 | HR 60 | Temp 98.2°F | Ht 65.0 in | Wt 174.0 lb

## 2018-12-01 DIAGNOSIS — C541 Malignant neoplasm of endometrium: Secondary | ICD-10-CM | POA: Diagnosis not present

## 2018-12-01 DIAGNOSIS — Z90710 Acquired absence of both cervix and uterus: Secondary | ICD-10-CM | POA: Diagnosis not present

## 2018-12-01 DIAGNOSIS — Z90722 Acquired absence of ovaries, bilateral: Secondary | ICD-10-CM | POA: Diagnosis not present

## 2018-12-01 NOTE — Patient Instructions (Signed)
Please notify Dr Quinterrius Errington at phone number [phone] if you notice vaginal bleeding, new pelvic or abdominal pains, bloating, feeling full easy, or a change in bladder or bowel function.   Please contact Dr Samyria Rudie's office (at [phone]) in September, 2020 to request an appointment with her for December, 2020.  

## 2018-12-01 NOTE — Progress Notes (Signed)
Consult Note: Gyn-Onc  Consult was requested by Dr. Nori Riis for the evaluation of Brooke [name] 63 y.o. female  CC:  Chief Complaint  Patient presents with  . Endometrial cancer [facility])    Assessment/Plan:  Ms. Brooke [name]  is a 64 y.o.  year old with a history of stage IA grade 2 endometrial cancer with low risk factors for recurrence and loss of MLH1 and BMS2. Genetics consultation noted hypermethylation of MLH1 and therefore Lynch syndrome testing not recommended.  No evidence of recurrence on today's examination.  Recommend continued 6 monthly pelvic examinations until April, 2022.   HPI: Brooke [name] is a very pleasant G0 woman who is seen in consultation at the request of Dr Nori Riis for grade 2 endometrial cancer.  The patient has a history of post-menopausal bleeding since October 2016.  She underwent US of the pelvis on 08/22/15 with Dr Nori Riis which revealed a uterus measuring 6.9 x 3.3x5.1cm and a thickened endometrial stripe.  A hysteroscopy, D&C was performed on 08/24/15 and pathology revealed FIGO grade 2 endometrial cancer.    She is otherwise very healthy. Postoperative compressive metabolic panel drawn on 37/34/2876 reveals a mildly elevated ALC at 55 (upper limit of normal is 32). AST was also mildly elevated at 37 (upper limit of normal is 32). She subsequently had an abdominal ultrasound scan on 09/16/2015 which revealed a liver with mild increased echogenicity compatible with hepatic steatosis. Otherwise the ultrasound was unremarkable. She has a history of hypertriglyceridemia and takes 1000 mg of fish oil for this but no statins. She's had no prior abdominal surgeries.  She has a remote history of a CKC in 1996 for high grade dysplasia, but normal paps since that time.  Interval Hx: On 09/23/15 she underwent robotic assisted total hysterectomy, BSO and SLN biopsy with Dr Alycia Rossetti at [facility]. Final pathology revealed a stage IA grade 2 endometrioid endometrial  adenocarcinoma with negative right SLN biopsy and negative left pelvic nodes.  Her tumor was tested for MSI and was noted to have deficiciencies in MLH-1 (though with hypermethylation) and BMS2. She was seen by genetics counselors here at [facility] and was determined to be low risk for genetic abnormality and therefore Lynch syndrome testing was negative.  She has a mountain house at [facility]  She works for the [employer].  No complaints or concerns. She recently had a Moh's procedure for a skin cancer on the tip of her nose.  We discussed survivorship issues including skin cancer awareness, and long term toxicity of surgery.    Current Meds:  Outpatient Encounter Medications as of 12/01/2018  Medication Sig  . calcium carbonate (OS-CAL - DOSED IN MG OF ELEMENTAL CALCIUM) 1250 (500 Ca) MG tablet Take 1 tablet by mouth 2 (two) times daily with a meal. Switched to Slow Cal- takes one a day  . cetirizine (ZYRTEC) 10 MG tablet Take 10 mg by mouth daily.  . Cholecalciferol (VITAMIN D3) 2000 units TABS Take 1 tablet by mouth daily.   . Magnesium 250 MG TABS Take 1 tablet by mouth 2 (two) times a week.  . omega-3 acid ethyl esters (LOVAZA) 1 g capsule daily.    No facility-administered encounter medications on file as of 12/01/2018.     Allergy:  Allergies  Allergen Reactions  . Penicillins Swelling    Rash,swelling on face,upper chest  . Strawberry (Diagnostic) Rash    Social Hx:   Social History   Socioeconomic History  .  Marital status: Married    Spouse name: Not on file  . Number of children: Not on file  . Years of education: Not on file  . Highest education level: Not on file  Occupational History  . Not on file  Social Needs  . Financial resource strain: Not on file  . Food insecurity    Worry: Not on file    Inability: Not on file  . Transportation needs    Medical: Not on file    Non-medical: Not on file  Tobacco Use  . Smoking status: Never  Smoker  . Smokeless tobacco: Never Used  Substance and Sexual Activity  . Alcohol use: Yes    Alcohol/week: 2.0 standard drinks    Types: 2 Cans of beer per week  . Drug use: No  . Sexual activity: Not on file  Lifestyle  . Physical activity    Days per week: Not on file    Minutes per session: Not on file  . Stress: Not on file  Relationships  . Social Herbalist on phone: Not on file    Gets together: Not on file    Attends religious service: Not on file    Active member of club or organization: Not on file    Attends meetings of clubs or organizations: Not on file    Relationship status: Not on file  . Intimate partner violence    Fear of current or ex partner: Not on file    Emotionally abused: Not on file    Physically abused: Not on file    Forced sexual activity: Not on file  Other Topics Concern  . Not on file  Social History Narrative  . Not on file    Past Surgical Hx:  Past Surgical History:  Procedure Laterality Date  . CERVICAL CONE BIOPSY    . HYSTEROSCOPY W/D&C N/A 08/24/2015   Procedure: DILATATION AND CURETTAGE /HYSTEROSCOPY;  Surgeon: Maisie Fus, MD;  Location: [facility];  Service: Gynecology;  Laterality: N/A;    Past Medical Hx:  Past Medical History:  Diagnosis Date  . Allergy   . History of abnormal cervical Pap smear   . PMB (postmenopausal bleeding)   . Thickened endometrium   . Uterine cancer [facility])     Past Gynecological History:  CKC for dysplasia 1996. G0  No LMP recorded. Patient is postmenopausal.  Family Hx:  Family History  Adopted: Yes    Review of Systems:  Constitutional  Feels well,    ENT Normal appearing ears and nares bilaterally Skin/Breast  No rash, sores, jaundice, itching, dryness Cardiovascular  No chest pain, shortness of breath, or edema  Pulmonary  No cough or wheeze.  Gastro Intestinal  No nausea, vomitting, or diarrhoea. No bright red blood per rectum, no abdominal pain,  change in bowel movement, or constipation.  Genito Urinary  No frequency, urgency, dysuria, no bleeding Musculo Skeletal  No myalgia, arthralgia, joint swelling or pain  Neurologic  No weakness, numbness, change in gait,  Psychology  No depression, anxiety, insomnia.   Vitals:  Blood pressure (!) 141/98, pulse 60, temperature 98.2 F (36.8 C), temperature source Oral, height 5' 5"  (1.651 m), weight 174 lb (78.9 kg), SpO2 100 %.  Physical Exam: WD in NAD Neck  Supple NROM, without any enlargements.  Lymph Node Survey No cervical supraclavicular or inguinal adenopathy Cardiovascular  Pulse normal rate, regularity and rhythm. S1 and S2 normal.  Lungs  Clear to  auscultation bilateraly, without wheezes/crackles/rhonchi. Good air movement.  Skin  No rash/lesions/breakdown  Psychiatry  Alert and oriented to person, place, and time  Abdomen  Normoactive bowel sounds, abdomen soft, non-tender and slightly overweight without evidence of hernia.Incisions well healed with no nodularity. Back No CVA tenderness Genito Urinary  Vulva/vagina: Normal external female genitalia.  No lesions. No discharge or bleeding.  Bladder/urethra:  No lesions or masses, well supported bladder  Vagina: normal, no lesions.  Cervix and uterus: surgically absent.  Adnexa: no palpable masses. Rectal  Good tone, no masses no cul de sac nodularity.  Extremities  No bilateral cyanosis, clubbing or edema.   Thereasa Solo, MD  12/01/2018, 2:38 PM

## 2018-12-10 DIAGNOSIS — Z0000 Encounter for general adult medical examination without abnormal findings: Secondary | ICD-10-CM | POA: Diagnosis not present

## 2018-12-10 DIAGNOSIS — E559 Vitamin D deficiency, unspecified: Secondary | ICD-10-CM | POA: Diagnosis not present

## 2018-12-10 DIAGNOSIS — E7800 Pure hypercholesterolemia, unspecified: Secondary | ICD-10-CM | POA: Diagnosis not present

## 2018-12-16 DIAGNOSIS — R3129 Other microscopic hematuria: Secondary | ICD-10-CM | POA: Diagnosis not present

## 2018-12-16 DIAGNOSIS — Z0000 Encounter for general adult medical examination without abnormal findings: Secondary | ICD-10-CM | POA: Diagnosis not present

## 2019-01-05 MED FILL — ETODOLAC 400 MG TABS: 400 | 10 days supply | Qty: 30 | Fill #0

## 2019-02-13 MED FILL — ETODOLAC 400 MG TABS: 400 | 10 days supply | Qty: 30 | Fill #1

## 2019-04-07 ENCOUNTER — Other Ambulatory Visit: Payer: Self-pay | Admitting: Internal Medicine

## 2019-04-07 ENCOUNTER — Telehealth: Payer: Self-pay | Admitting: *Deleted

## 2019-04-07 DIAGNOSIS — Z1231 Encounter for screening mammogram for malignant neoplasm of breast: Secondary | ICD-10-CM

## 2019-04-07 NOTE — Telephone Encounter (Signed)
Patient called and scheduled follow up appt

## 2019-05-11 ENCOUNTER — Telehealth: Payer: Self-pay | Admitting: *Deleted

## 2019-05-11 NOTE — Telephone Encounter (Signed)
Left the patient a message to call the office back. Need to move her appt up on 11/25

## 2019-05-13 ENCOUNTER — Encounter: Payer: Self-pay | Admitting: Gynecologic Oncology

## 2019-05-13 ENCOUNTER — Other Ambulatory Visit: Payer: Self-pay

## 2019-05-13 ENCOUNTER — Inpatient Hospital Stay: Payer: 59 | Attending: Gynecologic Oncology | Admitting: Gynecologic Oncology

## 2019-05-13 VITALS — BP 139/96 | HR 60 | Temp 97.6°F | Resp 18 | Ht 65.0 in | Wt 171.4 lb

## 2019-05-13 DIAGNOSIS — E781 Pure hyperglyceridemia: Secondary | ICD-10-CM | POA: Insufficient documentation

## 2019-05-13 DIAGNOSIS — Z8542 Personal history of malignant neoplasm of other parts of uterus: Secondary | ICD-10-CM

## 2019-05-13 DIAGNOSIS — Z90722 Acquired absence of ovaries, bilateral: Secondary | ICD-10-CM

## 2019-05-13 DIAGNOSIS — Z90710 Acquired absence of both cervix and uterus: Secondary | ICD-10-CM | POA: Diagnosis not present

## 2019-05-13 DIAGNOSIS — Z79899 Other long term (current) drug therapy: Secondary | ICD-10-CM | POA: Insufficient documentation

## 2019-05-13 DIAGNOSIS — Z08 Encounter for follow-up examination after completed treatment for malignant neoplasm: Secondary | ICD-10-CM | POA: Diagnosis not present

## 2019-05-13 DIAGNOSIS — C541 Malignant neoplasm of endometrium: Secondary | ICD-10-CM

## 2019-05-13 NOTE — Patient Instructions (Signed)
Please notify Dr Tequan Redmon at phone number [phone] if you notice vaginal bleeding, new pelvic or abdominal pains, bloating, feeling full easy, or a change in bladder or bowel function.  Please return to see Dr Osaze Hubbert in 6 months.  

## 2019-05-13 NOTE — Progress Notes (Signed)
Follow-up Note: Gyn-Onc  Consult was requested by Dr. Nori Riis for the evaluation of Brooke [name] 63 y.o. female  CC:  Chief Complaint  Patient presents with  . Endometrial cancer [facility])    Assessment/Plan:  Ms. Brooke [name]  is a 63 y.o.  year old with a history of stage IA grade 2 endometrial cancer with low risk factors for recurrence and loss of MLH1 and BMS2. Genetics consultation noted hypermethylation of MLH1 and therefore Lynch syndrome testing not recommended.  No evidence of recurrence on today's examination.  Recommend continued 6 monthly pelvic examinations until April, 2022.   HPI: Brooke [name] is a very pleasant G0 woman who is seen in consultation at the request of Dr Nori Riis for grade 2 endometrial cancer.  The patient has a history of post-menopausal bleeding since October 2016.  She underwent US of the pelvis on 08/22/15 with Dr Nori Riis which revealed a uterus measuring 6.9 x 3.3x5.1cm and a thickened endometrial stripe.   A hysteroscopy, D&C was performed on 08/24/15 and pathology revealed FIGO grade 2 endometrial cancer.    She is otherwise very healthy. Postoperative compressive metabolic panel drawn on 12/16/1599 reveals a mildly elevated ALC at 55 (upper limit of normal is 32). AST was also mildly elevated at 37 (upper limit of normal is 32). She subsequently had an abdominal ultrasound scan on 09/16/2015 which revealed a liver with mild increased echogenicity compatible with hepatic steatosis. Otherwise the ultrasound was unremarkable. She has a history of hypertriglyceridemia and takes 1000 mg of fish oil for this but no statins. She's had no prior abdominal surgeries.  She has a remote history of a CKC in 1996 for high grade dysplasia, but normal paps since that time.  On 09/23/15 she underwent robotic assisted total hysterectomy, BSO and SLN biopsy with Dr Alycia Rossetti at [facility]. Final pathology revealed a stage IA grade 2 endometrioid endometrial adenocarcinoma  with negative right SLN biopsy and negative left pelvic nodes.  Her tumor was tested for MSI and was noted to have deficiciencies in MLH-1 (though with hypermethylation) and BMS2. She was seen by genetics counselors here at [facility] and was determined to be low risk for genetic abnormality and therefore Lynch syndrome testing was negative.  She has a mountain house at [facility].  She works for the [employer].  Interval Hx:  No complaints or concerns.  Current Meds:  Outpatient Encounter Medications as of 05/13/2019  Medication Sig  . calcium carbonate (OS-CAL - DOSED IN MG OF ELEMENTAL CALCIUM) 1250 (500 Ca) MG tablet Take 1 tablet by mouth 2 (two) times daily with a meal. Switched to Slow Cal- takes one a day  . cetirizine (ZYRTEC) 10 MG tablet Take 10 mg by mouth daily.  . Cholecalciferol (VITAMIN D3) 2000 units TABS Take 1 tablet by mouth daily.   . Magnesium 250 MG TABS Take 1 tablet by mouth 2 (two) times a week.  . omega-3 acid ethyl esters (LOVAZA) 1 g capsule daily.    No facility-administered encounter medications on file as of 05/13/2019.     Allergy:  Allergies  Allergen Reactions  . Penicillins Swelling    Rash,swelling on face,upper chest  . Strawberry (Diagnostic) Rash    Social Hx:   Social History   Socioeconomic History  . Marital status: Married    Spouse name: Not on file  . Number of children: Not on file  . Years of education: Not on file  . Highest  education level: Not on file  Occupational History  . Not on file  Social Needs  . Financial resource strain: Not on file  . Food insecurity    Worry: Not on file    Inability: Not on file  . Transportation needs    Medical: Not on file    Non-medical: Not on file  Tobacco Use  . Smoking status: Never Smoker  . Smokeless tobacco: Never Used  Substance and Sexual Activity  . Alcohol use: Yes    Alcohol/week: 2.0 standard drinks    Types: 2 Cans of beer per week  . Drug use: No   . Sexual activity: Not on file  Lifestyle  . Physical activity    Days per week: Not on file    Minutes per session: Not on file  . Stress: Not on file  Relationships  . Social Herbalist on phone: Not on file    Gets together: Not on file    Attends religious service: Not on file    Active member of club or organization: Not on file    Attends meetings of clubs or organizations: Not on file    Relationship status: Not on file  . Intimate partner violence    Fear of current or ex partner: Not on file    Emotionally abused: Not on file    Physically abused: Not on file    Forced sexual activity: Not on file  Other Topics Concern  . Not on file  Social History Narrative  . Not on file    Past Surgical Hx:  Past Surgical History:  Procedure Laterality Date  . CERVICAL CONE BIOPSY    . HYSTEROSCOPY W/D&C N/A 08/24/2015   Procedure: DILATATION AND CURETTAGE /HYSTEROSCOPY;  Surgeon: Maisie Fus, MD;  Location: [facility];  Service: Gynecology;  Laterality: N/A;    Past Medical Hx:  Past Medical History:  Diagnosis Date  . Allergy   . History of abnormal cervical Pap smear   . PMB (postmenopausal bleeding)   . Thickened endometrium   . Uterine cancer [facility])     Past Gynecological History:  CKC for dysplasia 1996. G0  No LMP recorded. Patient is postmenopausal.  Family Hx:  Family History  Adopted: Yes    Review of Systems:  Constitutional  Feels well,    ENT Normal appearing ears and nares bilaterally Skin/Breast  No rash, sores, jaundice, itching, dryness Cardiovascular  No chest pain, shortness of breath, or edema  Pulmonary  No cough or wheeze.  Gastro Intestinal  No nausea, vomitting, or diarrhoea. No bright red blood per rectum, no abdominal pain, change in bowel movement, or constipation.  Genito Urinary  No frequency, urgency, dysuria, no bleeding Musculo Skeletal  No myalgia, arthralgia, joint swelling or pain  Neurologic   No weakness, numbness, change in gait,  Psychology  No depression, anxiety, insomnia.   Vitals:  Blood pressure (!) 139/96, pulse 60, temperature 97.6 F (36.4 C), temperature source Temporal, resp. rate 18, height 5' 5"  (1.651 m), weight 171 lb 6 oz (77.7 kg), SpO2 100 %.  Physical Exam: WD in NAD Neck  Supple NROM, without any enlargements.  Lymph Node Survey No cervical supraclavicular or inguinal adenopathy Cardiovascular  Pulse normal rate, regularity and rhythm. S1 and S2 normal.  Lungs  Clear to auscultation bilateraly, without wheezes/crackles/rhonchi. Good air movement.  Skin  No rash/lesions/breakdown  Psychiatry  Alert and oriented to person, place, and time  Abdomen  Normoactive bowel sounds, abdomen soft, non-tender and slightly overweight without evidence of hernia.Incisions well healed with no nodularity. Back No CVA tenderness Genito Urinary  Vulva/vagina: Normal external female genitalia.  No lesions. No discharge or bleeding.  Bladder/urethra:  No lesions or masses, well supported bladder  Vagina: normal, no lesions.  Cervix and uterus: surgically absent.  Adnexa: no palpable masses. Rectal  Good tone, no masses no cul de sac nodularity.  Extremities  No bilateral cyanosis, clubbing or edema.   Thereasa Solo, MD  05/13/2019, 9:34 AM

## 2019-05-20 ENCOUNTER — Other Ambulatory Visit: Payer: Self-pay

## 2019-05-20 ENCOUNTER — Ambulatory Visit
Admission: RE | Admit: 2019-05-20 | Discharge: 2019-05-20 | Disposition: A | Discharge status: Home / Self Care | Payer: 59 | Source: Ambulatory Visit | Attending: Internal Medicine | Admitting: Internal Medicine

## 2019-05-20 DIAGNOSIS — Z1231 Encounter for screening mammogram for malignant neoplasm of breast: Secondary | ICD-10-CM

## 2019-05-25 ENCOUNTER — Ambulatory Visit: Payer: 59

## 2019-11-02 ENCOUNTER — Encounter: Payer: Self-pay | Admitting: Gynecologic Oncology

## 2019-11-02 ENCOUNTER — Inpatient Hospital Stay: Payer: 59 | Attending: Gynecologic Oncology | Admitting: Gynecologic Oncology

## 2019-11-02 ENCOUNTER — Other Ambulatory Visit: Payer: Self-pay

## 2019-11-02 VITALS — BP 138/92 | HR 71 | Temp 98.2°F | Resp 16 | Ht 65.0 in | Wt 173.8 lb

## 2019-11-02 DIAGNOSIS — Z8542 Personal history of malignant neoplasm of other parts of uterus: Secondary | ICD-10-CM | POA: Insufficient documentation

## 2019-11-02 DIAGNOSIS — Z90722 Acquired absence of ovaries, bilateral: Secondary | ICD-10-CM | POA: Diagnosis not present

## 2019-11-02 DIAGNOSIS — Z90710 Acquired absence of both cervix and uterus: Secondary | ICD-10-CM | POA: Insufficient documentation

## 2019-11-02 DIAGNOSIS — Z08 Encounter for follow-up examination after completed treatment for malignant neoplasm: Secondary | ICD-10-CM | POA: Insufficient documentation

## 2019-11-02 DIAGNOSIS — E781 Pure hyperglyceridemia: Secondary | ICD-10-CM | POA: Insufficient documentation

## 2019-11-02 DIAGNOSIS — C541 Malignant neoplasm of endometrium: Secondary | ICD-10-CM | POA: Diagnosis not present

## 2019-11-02 NOTE — Progress Notes (Signed)
Follow-up Note: Gyn-Onc  Consult was requested by Dr. Nori Riis for the evaluation of Brooke [name] 64 y.o. female  CC:  Chief Complaint  Patient presents with  . Endometrial cancer ([place])    Follow up    Assessment/Plan:  Brooke [name]  is a 64 y.o.  year old with a history of stage IA grade 2 endometrial cancer, s/p staging on 10/03/15, with low risk factors for recurrence and loss of MLH1 and PMS2. Genetics consultation noted hypermethylation of MLH1 and therefore Lynch syndrome testing not recommended.  No evidence of recurrence on today's examination.  Recommend continued 6 monthly pelvic examinations until April, 2022.   HPI: Brooke [name] is a very pleasant G0 woman who is seen in consultation at the request of Dr Nori Riis for grade 2 endometrial cancer.  The patient has a history of post-menopausal bleeding since October 2016.  She underwent US of the pelvis on 08/22/15 with Dr Nori Riis which revealed a uterus measuring 6.9 x 3.3x5.1cm and a thickened endometrial stripe.   A hysteroscopy, D&C was performed on 08/24/15 and pathology revealed FIGO grade 2 endometrial cancer.    She is otherwise very healthy. Postoperative compressive metabolic panel drawn on 70/62/3762 reveals a mildly elevated ALC at 55 (upper limit of normal is 32). AST was also mildly elevated at 37 (upper limit of normal is 32). She subsequently had an abdominal ultrasound scan on 09/16/2015 which revealed a liver with mild increased echogenicity compatible with hepatic steatosis. Otherwise the ultrasound was unremarkable. She has a history of hypertriglyceridemia and takes 1000 mg of fish oil for this but no statins. She's had no prior abdominal surgeries.  She has a remote history of a CKC in 1996 for high grade dysplasia, but normal paps since that time.  On 09/23/15 she underwent robotic assisted total hysterectomy, BSO and SLN biopsy with Dr Alycia Rossetti at [facility]. Final pathology revealed a stage IA grade 2  endometrioid endometrial adenocarcinoma with negative right SLN biopsy and negative left pelvic nodes.  Her tumor was tested for MSI and was noted to have deficiciencies in MLH-1 (though with hypermethylation) and BMS2. She was seen by genetics counselors here at [facility] and was determined to be low risk for genetic abnormality and therefore Lynch syndrome testing was negative.  She has a mountain house at [facility].  She works for the [employer].  Interval Hx:  No complaints or concerns specific to her cancer.  She plans to go to [facility] next week.  She has noticed intermittent rushing/pulsing sound in her right ear in the past 2 weeks. She plans to talk to her PCP about this. She denies other neurologic symptoms.   Current Meds:  Outpatient Encounter Medications as of 11/02/2019  Medication Sig  . calcium carbonate (OS-CAL - DOSED IN MG OF ELEMENTAL CALCIUM) 1250 (500 Ca) MG tablet Take 1 tablet by mouth 2 (two) times daily with a meal. Switched to Slow Cal- takes one a day  . cetirizine (ZYRTEC) 10 MG tablet Take 10 mg by mouth daily.  . Cholecalciferol (VITAMIN D3) 2000 units TABS Take 1 tablet by mouth daily.   . Magnesium 250 MG TABS Take 1 tablet by mouth 2 (two) times a week.  . omega-3 acid ethyl esters (LOVAZA) 1 g capsule daily.    No facility-administered encounter medications on file as of 11/02/2019.    Allergy:  Allergies  Allergen Reactions  . Penicillins Swelling    Rash,swelling  on face,upper chest  . Strawberry (Diagnostic) Rash    Social Hx:   Social History   Socioeconomic History  . Marital status: Married    Spouse name: Not on file  . Number of children: Not on file  . Years of education: Not on file  . Highest education level: Not on file  Occupational History  . Not on file  Tobacco Use  . Smoking status: Never Smoker  . Smokeless tobacco: Never Used  Substance and Sexual Activity  . Alcohol use: Yes     Alcohol/week: 2.0 standard drinks    Types: 2 Cans of beer per week  . Drug use: No  . Sexual activity: Not on file  Other Topics Concern  . Not on file  Social History Narrative  . Not on file   Social Determinants of Health   Financial Resource Strain:   . Difficulty of Paying Living Expenses:   Food Insecurity:   . Worried About Charity fundraiser in the Last Year:   . Arboriculturist in the Last Year:   Transportation Needs:   . Film/video editor (Medical):   Marland Kitchen Lack of Transportation (Non-Medical):   Physical Activity:   . Days of Exercise per Week:   . Minutes of Exercise per Session:   Stress:   . Feeling of Stress :   Social Connections:   . Frequency of Communication with Friends and Family:   . Frequency of Social Gatherings with Friends and Family:   . Attends Religious Services:   . Active Member of Clubs or Organizations:   . Attends Archivist Meetings:   Marland Kitchen Marital Status:   Intimate Partner Violence:   . Fear of Current or Ex-Partner:   . Emotionally Abused:   Marland Kitchen Physically Abused:   . Sexually Abused:     Past Surgical Hx:  Past Surgical History:  Procedure Laterality Date  . CERVICAL CONE BIOPSY    . HYSTEROSCOPY WITH D & C N/A 08/24/2015   Procedure: DILATATION AND CURETTAGE /HYSTEROSCOPY;  Surgeon: Maisie Fus, MD;  Location: [facility];  Service: Gynecology;  Laterality: N/A;    Past Medical Hx:  Past Medical History:  Diagnosis Date  . Allergy   . History of abnormal cervical Pap smear   . PMB (postmenopausal bleeding)   . Thickened endometrium   . Uterine cancer [facility])     Past Gynecological History:  CKC for dysplasia 1996. G0  No LMP recorded. Patient is postmenopausal.  Family Hx:  Family History  Adopted: Yes    Review of Systems:  Constitutional  Feels well,    ENT Normal appearing ears and nares bilaterally Skin/Breast  No rash, sores, jaundice, itching, dryness Cardiovascular  No chest  pain, shortness of breath, or edema  Pulmonary  No cough or wheeze.  Gastro Intestinal  No nausea, vomitting, or diarrhoea. No bright red blood per rectum, no abdominal pain, change in bowel movement, or constipation.  Genito Urinary  No frequency, urgency, dysuria, no bleeding Musculo Skeletal  No myalgia, arthralgia, joint swelling or pain  Neurologic  No weakness, numbness, change in gait,  Psychology  No depression, anxiety, insomnia.   Vitals:  Blood pressure (!) 138/92, pulse 71, temperature 98.2 F (36.8 C), temperature source Temporal, resp. rate 16, height 5' 5"  (1.651 m), weight 173 lb 12.8 oz (78.8 kg), SpO2 100 %.  Physical Exam: WD in NAD Neck  Supple NROM, without any enlargements.  Lymph  Node Survey No cervical supraclavicular or inguinal adenopathy Cardiovascular  Pulse normal rate, regularity and rhythm. S1 and S2 normal.  Lungs  Clear to auscultation bilateraly, without wheezes/crackles/rhonchi. Good air movement.  Skin  No rash/lesions/breakdown  Psychiatry  Alert and oriented to person, place, and time  Abdomen  Normoactive bowel sounds, abdomen soft, non-tender and slightly overweight without evidence of hernia.Incisions well healed with no nodularity. Back No CVA tenderness Genito Urinary  Vulva/vagina: Normal external female genitalia.  No lesions. No discharge or bleeding.  Bladder/urethra:  No lesions or masses, well supported bladder  Vagina: normal, no lesions.  Cervix and uterus: surgically absent.  Adnexa: no palpable masses. Rectal  Good tone, no masses no cul de sac nodularity.  Extremities  No bilateral cyanosis, clubbing or edema.   Thereasa Solo, MD  11/02/2019, 1:00 PM

## 2019-11-02 NOTE — Patient Instructions (Signed)
Please notify Dr Denman George at phone number [phone] if you notice vaginal bleeding, new pelvic or abdominal pains, bloating, feeling full easy, or a change in bladder or bowel function.   Please contact Dr Serita Grit office (at [phone]) in August to request an appointment with her for November, 2021.

## 2019-11-11 ENCOUNTER — Ambulatory Visit: Payer: 59 | Admitting: Gynecologic Oncology

## 2019-12-16 DIAGNOSIS — Z0000 Encounter for general adult medical examination without abnormal findings: Secondary | ICD-10-CM | POA: Diagnosis not present

## 2019-12-16 DIAGNOSIS — R748 Abnormal levels of other serum enzymes: Secondary | ICD-10-CM | POA: Diagnosis not present

## 2019-12-16 DIAGNOSIS — E7800 Pure hypercholesterolemia, unspecified: Secondary | ICD-10-CM | POA: Diagnosis not present

## 2019-12-22 DIAGNOSIS — Z0000 Encounter for general adult medical examination without abnormal findings: Secondary | ICD-10-CM | POA: Diagnosis not present

## 2019-12-28 MED FILL — CLINDAMYCIN HCL 150 MG CAPS: 150 | 7 days supply | Qty: 21 | Fill #0

## 2020-02-03 DIAGNOSIS — Z85828 Personal history of other malignant neoplasm of skin: Secondary | ICD-10-CM | POA: Diagnosis not present

## 2020-02-03 DIAGNOSIS — L738 Other specified follicular disorders: Secondary | ICD-10-CM | POA: Diagnosis not present

## 2020-02-03 DIAGNOSIS — L720 Epidermal cyst: Secondary | ICD-10-CM | POA: Diagnosis not present

## 2020-02-03 DIAGNOSIS — L821 Other seborrheic keratosis: Secondary | ICD-10-CM | POA: Diagnosis not present

## 2020-02-03 DIAGNOSIS — D2262 Melanocytic nevi of left upper limb, including shoulder: Secondary | ICD-10-CM | POA: Diagnosis not present

## 2020-02-03 DIAGNOSIS — C44619 Basal cell carcinoma of skin of left upper limb, including shoulder: Secondary | ICD-10-CM | POA: Diagnosis not present

## 2020-02-03 DIAGNOSIS — D2272 Melanocytic nevi of left lower limb, including hip: Secondary | ICD-10-CM | POA: Diagnosis not present

## 2020-02-03 DIAGNOSIS — D2261 Melanocytic nevi of right upper limb, including shoulder: Secondary | ICD-10-CM | POA: Diagnosis not present

## 2020-02-03 DIAGNOSIS — D485 Neoplasm of uncertain behavior of skin: Secondary | ICD-10-CM | POA: Diagnosis not present

## 2020-02-03 DIAGNOSIS — D2372 Other benign neoplasm of skin of left lower limb, including hip: Secondary | ICD-10-CM | POA: Diagnosis not present

## 2020-03-08 DIAGNOSIS — H5203 Hypermetropia, bilateral: Secondary | ICD-10-CM | POA: Diagnosis not present

## 2020-03-08 DIAGNOSIS — H524 Presbyopia: Secondary | ICD-10-CM | POA: Diagnosis not present

## 2020-03-08 DIAGNOSIS — H52223 Regular astigmatism, bilateral: Secondary | ICD-10-CM | POA: Diagnosis not present

## 2020-04-12 ENCOUNTER — Other Ambulatory Visit: Payer: Self-pay | Admitting: Internal Medicine

## 2020-04-12 DIAGNOSIS — Z1231 Encounter for screening mammogram for malignant neoplasm of breast: Secondary | ICD-10-CM

## 2020-05-09 ENCOUNTER — Encounter: Payer: Self-pay | Admitting: Gynecologic Oncology

## 2020-05-09 ENCOUNTER — Inpatient Hospital Stay: Payer: 59 | Attending: Gynecologic Oncology | Admitting: Gynecologic Oncology

## 2020-05-09 ENCOUNTER — Other Ambulatory Visit: Payer: Self-pay

## 2020-05-09 VITALS — BP 133/88 | HR 87 | Temp 97.6°F | Resp 18 | Wt 171.4 lb

## 2020-05-09 DIAGNOSIS — Z08 Encounter for follow-up examination after completed treatment for malignant neoplasm: Secondary | ICD-10-CM | POA: Diagnosis not present

## 2020-05-09 DIAGNOSIS — Z90722 Acquired absence of ovaries, bilateral: Secondary | ICD-10-CM | POA: Insufficient documentation

## 2020-05-09 DIAGNOSIS — C541 Malignant neoplasm of endometrium: Secondary | ICD-10-CM

## 2020-05-09 DIAGNOSIS — Z90710 Acquired absence of both cervix and uterus: Secondary | ICD-10-CM | POA: Diagnosis not present

## 2020-05-09 DIAGNOSIS — Z8542 Personal history of malignant neoplasm of other parts of uterus: Secondary | ICD-10-CM | POA: Diagnosis not present

## 2020-05-09 DIAGNOSIS — Z8543 Personal history of malignant neoplasm of ovary: Secondary | ICD-10-CM | POA: Diagnosis not present

## 2020-05-09 NOTE — Patient Instructions (Signed)
Please notify Dr Denman George at phone number [phone] if you notice vaginal bleeding, new pelvic or abdominal pains, bloating, feeling full easy, or a change in bladder or bowel function.   Dr Denman George recommends follow-up with her in May, 2022. If you are doing well at that time, you will no longer require cancer surveillance exams.

## 2020-05-09 NOTE — Progress Notes (Signed)
Follow-up Note: Gyn-Onc  Consult was requested by Dr. Nori Riis for the evaluation of Brooke [name] 64 y.o. female  CC:  Chief Complaint  Patient presents with  . Endometrial cancer [facility])    Assessment/Plan:  Ms. Brooke [name]  is a 64 y.o.  year old with a history of stage IA grade 2 endometrial cancer, s/p staging on 10/03/15, with low risk factors for recurrence and loss of MLH1 and PMS2. Genetics consultation noted hypermethylation of MLH1 and therefore Lynch syndrome testing not recommended.  No evidence of recurrence on today's examination.  Recommend continued 6 monthly pelvic examinations until April, 2022 after which time her cancer surveillance will end if she remains cancer free.   HPI: Brooke [name] is a very pleasant G0 woman who is seen in consultation at the request of Dr Nori Riis for grade 2 endometrial cancer.  The patient has a history of post-menopausal bleeding since October 2016.  She underwent US of the pelvis on 08/22/15 with Dr Nori Riis which revealed a uterus measuring 6.9 x 3.3x5.1cm and a thickened endometrial stripe.   A hysteroscopy, D&C was performed on 08/24/15 and pathology revealed FIGO grade 2 endometrial cancer.    She is otherwise very healthy. Postoperative compressive metabolic panel drawn on 07/86/7544 reveals a mildly elevated ALC at 55 (upper limit of normal is 32). AST was also mildly elevated at 37 (upper limit of normal is 32). She subsequently had an abdominal ultrasound scan on 09/16/2015 which revealed a liver with mild increased echogenicity compatible with hepatic steatosis. Otherwise the ultrasound was unremarkable. She has a history of hypertriglyceridemia and takes 1000 mg of fish oil for this but no statins. She's had no prior abdominal surgeries.  She has a remote history of a CKC in 1996 for high grade dysplasia, but normal paps since that time.  On 09/23/15 she underwent robotic assisted total hysterectomy, BSO and SLN biopsy with Dr Alycia Rossetti at  [facility]. Final pathology revealed a stage IA grade 2 endometrioid endometrial adenocarcinoma with negative right SLN biopsy and negative left pelvic nodes.  Her tumor was tested for MSI and was noted to have deficiciencies in MLH-1 (though with hypermethylation) and PMS2. She was seen by genetics counselors here at [facility] and was determined to be low risk for genetic abnormality and therefore Lynch syndrome testing was negative.  She has a mountain house at [facility].  She works for the [employer].  Interval Hx:  No complaints or concerns specific to her cancer.  Current Meds:  Outpatient Encounter Medications as of 05/09/2020  Medication Sig  . calcium carbonate (OS-CAL - DOSED IN MG OF ELEMENTAL CALCIUM) 1250 (500 Ca) MG tablet Take 1 tablet by mouth 2 (two) times daily with a meal. Switched to Slow Cal- takes one a day  . cetirizine (ZYRTEC) 10 MG tablet Take 10 mg by mouth daily.  . cholecalciferol (VITAMIN D) 25 MCG (1000 UNIT) tablet Take 1,000 Units by mouth daily.  Marland Kitchen omega-3 acid ethyl esters (LOVAZA) 1 g capsule daily.   . [DISCONTINUED] Cholecalciferol (VITAMIN D3) 2000 units TABS Take 1 tablet by mouth daily.   . [DISCONTINUED] Magnesium 250 MG TABS Take 1 tablet by mouth 2 (two) times a week.   No facility-administered encounter medications on file as of 05/09/2020.    Allergy:  Allergies  Allergen Reactions  . Penicillins Swelling    Rash,swelling on face,upper chest  . Strawberry (Diagnostic) Rash    Social Hx:  Social History   Socioeconomic History  . Marital status: Married    Spouse name: Not on file  . Number of children: Not on file  . Years of education: Not on file  . Highest education level: Not on file  Occupational History  . Not on file  Tobacco Use  . Smoking status: Never Smoker  . Smokeless tobacco: Never Used  Vaping Use  . Vaping Use: Never used  Substance and Sexual Activity  . Alcohol use: Yes     Alcohol/week: 2.0 standard drinks    Types: 2 Cans of beer per week  . Drug use: No  . Sexual activity: Not on file  Other Topics Concern  . Not on file  Social History Narrative  . Not on file   Social Determinants of Health   Financial Resource Strain:   . Difficulty of Paying Living Expenses: Not on file  Food Insecurity:   . Worried About Charity fundraiser in the Last Year: Not on file  . Ran Out of Food in the Last Year: Not on file  Transportation Needs:   . Lack of Transportation (Medical): Not on file  . Lack of Transportation (Non-Medical): Not on file  Physical Activity:   . Days of Exercise per Week: Not on file  . Minutes of Exercise per Session: Not on file  Stress:   . Feeling of Stress : Not on file  Social Connections:   . Frequency of Communication with Friends and Family: Not on file  . Frequency of Social Gatherings with Friends and Family: Not on file  . Attends Religious Services: Not on file  . Active Member of Clubs or Organizations: Not on file  . Attends Archivist Meetings: Not on file  . Marital Status: Not on file  Intimate Partner Violence:   . Fear of Current or Ex-Partner: Not on file  . Emotionally Abused: Not on file  . Physically Abused: Not on file  . Sexually Abused: Not on file    Past Surgical Hx:  Past Surgical History:  Procedure Laterality Date  . CERVICAL CONE BIOPSY    . HYSTEROSCOPY WITH D & C N/A 08/24/2015   Procedure: DILATATION AND CURETTAGE /HYSTEROSCOPY;  Surgeon: Maisie Fus, MD;  Location: [facility];  Service: Gynecology;  Laterality: N/A;    Past Medical Hx:  Past Medical History:  Diagnosis Date  . Allergy   . History of abnormal cervical Pap smear   . PMB (postmenopausal bleeding)   . Thickened endometrium   . Uterine cancer [facility])     Past Gynecological History:  CKC for dysplasia 1996. G0  No LMP recorded. Patient is postmenopausal.  Family Hx:  Family History  Adopted: Yes     Review of Systems:  Constitutional  Feels well,    ENT Normal appearing ears and nares bilaterally Skin/Breast  No rash, sores, jaundice, itching, dryness Cardiovascular  No chest pain, shortness of breath, or edema  Pulmonary  No cough or wheeze.  Gastro Intestinal  No nausea, vomitting, or diarrhoea. No bright red blood per rectum, no abdominal pain, change in bowel movement, or constipation.  Genito Urinary  No frequency, urgency, dysuria, no bleeding Musculo Skeletal  No myalgia, arthralgia, joint swelling or pain  Neurologic  No weakness, numbness, change in gait,  Psychology  No depression, anxiety, insomnia.   Vitals:  Blood pressure 133/88, pulse 87, temperature 97.6 F (36.4 C), temperature source Tympanic, resp. rate 18, weight  171 lb 6.4 oz (77.7 kg), SpO2 99 %.  Physical Exam: WD in NAD Neck  Supple NROM, without any enlargements.  Lymph Node Survey No cervical supraclavicular or inguinal adenopathy Cardiovascular  Pulse normal rate, regularity and rhythm. S1 and S2 normal.  Lungs  Clear to auscultation bilateraly, without wheezes/crackles/rhonchi. Good air movement.  Skin  No rash/lesions/breakdown  Psychiatry  Alert and oriented to person, place, and time  Abdomen  Normoactive bowel sounds, abdomen soft, non-tender and slightly overweight without evidence of hernia.Incisions well healed with no nodularity. Back No CVA tenderness Genito Urinary  Vulva/vagina: Normal external female genitalia.  No lesions. No discharge or bleeding.  Bladder/urethra:  No lesions or masses, well supported bladder  Vagina: normal, no lesions.  Cervix and uterus: surgically absent.  Adnexa: no palpable masses. Rectal  Good tone, no masses no cul de sac nodularity.  Extremities  No bilateral cyanosis, clubbing or edema.   Thereasa Solo, MD  05/09/2020, 3:44 PM

## 2020-05-24 ENCOUNTER — Other Ambulatory Visit: Payer: Self-pay

## 2020-05-24 ENCOUNTER — Ambulatory Visit
Admission: RE | Admit: 2020-05-24 | Discharge: 2020-05-24 | Disposition: A | Discharge status: Home / Self Care | Payer: 59 | Source: Ambulatory Visit | Attending: Internal Medicine | Admitting: Internal Medicine

## 2020-05-24 DIAGNOSIS — Z1231 Encounter for screening mammogram for malignant neoplasm of breast: Secondary | ICD-10-CM

## 2020-11-04 ENCOUNTER — Inpatient Hospital Stay: Payer: 59 | Attending: Gynecologic Oncology | Admitting: Gynecologic Oncology

## 2020-11-04 ENCOUNTER — Other Ambulatory Visit: Payer: Self-pay

## 2020-11-04 ENCOUNTER — Encounter: Payer: Self-pay | Admitting: Gynecologic Oncology

## 2020-11-04 VITALS — BP 138/96 | HR 86 | Temp 97.5°F | Resp 16 | Ht 65.0 in | Wt 170.0 lb

## 2020-11-04 DIAGNOSIS — Z90722 Acquired absence of ovaries, bilateral: Secondary | ICD-10-CM | POA: Insufficient documentation

## 2020-11-04 DIAGNOSIS — C541 Malignant neoplasm of endometrium: Secondary | ICD-10-CM

## 2020-11-04 DIAGNOSIS — Z90710 Acquired absence of both cervix and uterus: Secondary | ICD-10-CM | POA: Insufficient documentation

## 2020-11-04 DIAGNOSIS — Z8542 Personal history of malignant neoplasm of other parts of uterus: Secondary | ICD-10-CM | POA: Diagnosis not present

## 2020-11-04 NOTE — Patient Instructions (Signed)
Please notify Dr Denman George at phone number [phone] if you notice vaginal bleeding, new pelvic or abdominal pains, bloating, feeling full easy, or a change in bladder or bowel function.   As you have reached your 5 year mark, you no longer require scheduled gynecologic visits and have completed your cancer surveillance period. Congratulations! However, feel free to reach out to Dr Serita Grit number if you have questions or concerns.

## 2020-11-04 NOTE — Progress Notes (Signed)
Follow-up Note: Gyn-Onc  Consult was requested by Dr. Nori Riis for the evaluation of Brooke [name] 65 y.o. female  CC:  Chief Complaint  Patient presents with  . Endometrial cancer [facility]    Assessment/Plan:  Brooke [name]  is a 65 y.o.  year old with a history of stage IA grade 2 endometrial cancer, s/p staging on 10/03/15, with low risk factors for recurrence and loss of MLH1 and PMS2.Hypermethylation of MLH1 present and therefore Lynch syndrome testing was not recommended.  No evidence of recurrence on today's examination.  Recommend that we suspend scheduled cancer surveillance as she has remained cancer free for >5 years.   HPI: Brooke [name] is a very pleasant G0 woman who is seen in consultation at the request of Dr Nori Riis for grade 2 endometrial cancer.  The patient has a history of post-menopausal bleeding since October 2016.  She underwent US of the pelvis on 08/22/15 with Dr Nori Riis which revealed a uterus measuring 6.9 x 3.3x5.1cm and a thickened endometrial stripe.   A hysteroscopy, D&C was performed on 08/24/15 and pathology revealed FIGO grade 2 endometrial cancer.    On 09/23/15 she underwent robotic assisted total hysterectomy, BSO and SLN biopsy with Dr Alycia Rossetti at [facility]. Final pathology revealed a stage IA grade 2 endometrioid endometrial adenocarcinoma with negative right SLN biopsy and negative left pelvic nodes.  Her tumor was tested for MSI and was noted to have deficiciencies in MLH-1 (though with hypermethylation) and PMS2. She was seen by genetics counselors here at [facility] and was determined to be low risk for genetic abnormality and therefore Lynch syndrome testing was negative.  She has a mountain house at [facility].  She works for the [employer].  Interval Hx: No complaints or concerns specific to her cancer.  Current Meds:  Outpatient Encounter Medications as of 11/04/2020  Medication Sig  . calcium carbonate (OS-CAL - DOSED  IN MG OF ELEMENTAL CALCIUM) 1250 (500 Ca) MG tablet Take 1 tablet by mouth 2 (two) times daily with a meal. Switched to Slow Cal- takes one a day  . cetirizine (ZYRTEC) 10 MG tablet Take 10 mg by mouth daily.  . cholecalciferol (VITAMIN D) 25 MCG (1000 UNIT) tablet Take 1,000 Units by mouth daily.  Marland Kitchen omega-3 acid ethyl esters (LOVAZA) 1 g capsule daily.  (Patient not taking: Reported on 11/04/2020)   No facility-administered encounter medications on file as of 11/04/2020.    Allergy:  Allergies  Allergen Reactions  . Penicillins Swelling    Rash,swelling on face,upper chest  . Strawberry (Diagnostic) Rash    Social Hx:   Social History   Socioeconomic History  . Marital status: Married    Spouse name: Not on file  . Number of children: Not on file  . Years of education: Not on file  . Highest education level: Not on file  Occupational History  . Not on file  Tobacco Use  . Smoking status: Never Smoker  . Smokeless tobacco: Never Used  Vaping Use  . Vaping Use: Never used  Substance and Sexual Activity  . Alcohol use: Yes    Alcohol/week: 2.0 standard drinks    Types: 2 Cans of beer per week  . Drug use: No  . Sexual activity: Not on file  Other Topics Concern  . Not on file  Social History Narrative  . Not on file   Social Determinants of Health   Financial Resource Strain: Not on file  Food Insecurity: Not on file  Transportation Needs: Not on file  Physical Activity: Not on file  Stress: Not on file  Social Connections: Not on file  Intimate Partner Violence: Not on file    Past Surgical Hx:  Past Surgical History:  Procedure Laterality Date  . CERVICAL CONE BIOPSY    . HYSTEROSCOPY WITH D & C N/A 08/24/2015   Procedure: DILATATION AND CURETTAGE /HYSTEROSCOPY;  Surgeon: Maisie Fus, MD;  Location: [facility];  Service: Gynecology;  Laterality: N/A;    Past Medical Hx:  Past Medical History:  Diagnosis Date  . Allergy   . History of  abnormal cervical Pap smear   . PMB (postmenopausal bleeding)   . Thickened endometrium   . Uterine cancer [facility])     Past Gynecological History:  CKC for dysplasia 1996. G0  No LMP recorded. Patient is postmenopausal.  Family Hx:  Family History  Adopted: Yes    Review of Systems:  Constitutional  Feels well,    ENT Normal appearing ears and nares bilaterally Skin/Breast  No rash, sores, jaundice, itching, dryness Cardiovascular  No chest pain, shortness of breath, or edema  Pulmonary  No cough or wheeze.  Gastro Intestinal  No nausea, vomitting, or diarrhoea. No bright red blood per rectum, no abdominal pain, change in bowel movement, or constipation.  Genito Urinary  No frequency, urgency, dysuria, no bleeding Musculo Skeletal  No myalgia, arthralgia, joint swelling or pain  Neurologic  No weakness, numbness, change in gait,  Psychology  No depression, anxiety, insomnia.   Vitals:  Blood pressure (!) 138/96, pulse 86, temperature (!) 97.5 F (36.4 C), temperature source Tympanic, resp. rate 16, height 5' 5"  (1.651 m), weight 170 lb (77.1 kg), SpO2 97 %.  Physical Exam: WD in NAD Neck  Supple NROM, without any enlargements.  Lymph Node Survey No cervical supraclavicular or inguinal adenopathy Cardiovascular  Pulse normal rate, regularity and rhythm. S1 and S2 normal.  Lungs  Clear to auscultation bilateraly, without wheezes/crackles/rhonchi. Good air movement.  Skin  No rash/lesions/breakdown  Psychiatry  Alert and oriented to person, place, and time  Abdomen  Normoactive bowel sounds, abdomen soft, non-tender and slightly overweight without evidence of hernia.Incisions well healed with no nodularity. Back No CVA tenderness Genito Urinary  Vulva/vagina: Normal external female genitalia.  No lesions. No discharge or bleeding.  Bladder/urethra:  No lesions or masses, well supported bladder  Vagina: normal, no lesions.  Cervix and uterus: surgically  absent.  Adnexa: no palpable masses. Rectal  Good tone, no masses no cul de sac nodularity.  Extremities  No bilateral cyanosis, clubbing or edema.   Thereasa Solo, MD  11/04/2020, 3:19 PM

## 2020-12-22 DIAGNOSIS — Z0000 Encounter for general adult medical examination without abnormal findings: Secondary | ICD-10-CM | POA: Diagnosis not present

## 2020-12-23 ENCOUNTER — Other Ambulatory Visit (HOSPITAL_COMMUNITY): Payer: Self-pay

## 2020-12-23 DIAGNOSIS — Z0000 Encounter for general adult medical examination without abnormal findings: Secondary | ICD-10-CM | POA: Diagnosis not present

## 2020-12-23 DIAGNOSIS — R3129 Other microscopic hematuria: Secondary | ICD-10-CM | POA: Diagnosis not present

## 2020-12-23 DIAGNOSIS — E7800 Pure hypercholesterolemia, unspecified: Secondary | ICD-10-CM | POA: Diagnosis not present

## 2020-12-23 MED ORDER — OMEGA-3-ACID ETHYL ESTERS 1 G PO CAPS
2.0000 | ORAL_CAPSULE | Freq: Two times a day (BID) | ORAL | 11 refills | Status: AC
  Filled 2020-12-23 – 2021-12-15 (×4): qty 120, 30d supply, fill #0

## 2020-12-23 MED ORDER — OMEGA-3-ACID ETHYL ESTERS 1 G PO CAPS
2.0000 g | ORAL_CAPSULE | Freq: Two times a day (BID) | ORAL | 11 refills | Status: AC
  Filled 2020-12-23 – 2020-12-27 (×4): qty 120, 30d supply, fill #0

## 2020-12-26 ENCOUNTER — Other Ambulatory Visit (HOSPITAL_COMMUNITY): Payer: Self-pay

## 2020-12-27 ENCOUNTER — Other Ambulatory Visit (HOSPITAL_COMMUNITY): Payer: Self-pay

## 2020-12-28 ENCOUNTER — Other Ambulatory Visit (HOSPITAL_COMMUNITY): Payer: Self-pay

## 2020-12-29 ENCOUNTER — Other Ambulatory Visit (HOSPITAL_COMMUNITY): Payer: Self-pay

## 2020-12-30 ENCOUNTER — Other Ambulatory Visit (HOSPITAL_COMMUNITY): Payer: Self-pay

## 2021-01-03 ENCOUNTER — Other Ambulatory Visit (HOSPITAL_COMMUNITY): Payer: Self-pay

## 2021-01-24 ENCOUNTER — Other Ambulatory Visit: Payer: Self-pay

## 2021-01-24 ENCOUNTER — Ambulatory Visit: Payer: 59 | Attending: Internal Medicine

## 2021-01-24 DIAGNOSIS — Z23 Encounter for immunization: Secondary | ICD-10-CM

## 2021-01-24 MED ORDER — PFIZER-BIONT COVID-19 VAC-TRIS 30 MCG/0.3ML IM SUSP
INTRAMUSCULAR | 0 refills | Status: AC
  Filled 2021-01-24: qty 0.3, 1d supply, fill #0

## 2021-01-24 NOTE — Progress Notes (Signed)
   Covid-19 Vaccination Clinic  Name:  Brooke [name]    MRN: [id number] DOB: 1956/05/20  01/24/2021  [name] was observed post Covid-19 immunization for 15 minutes without incident. She was provided with Vaccine Information Sheet and instruction to access the V-Safe system.   [name] was instructed to call 911 with any severe reactions post vaccine: Difficulty breathing  Swelling of face and throat  A fast heartbeat  A bad rash all over body  Dizziness and weakness   Immunizations Administered     Name Date Dose VIS Date Route   PFIZER Comrnaty(Gray TOP) Covid-19 Vaccine 01/24/2021  9:07 AM 0.3 mL 05/26/2020 Intramuscular   Manufacturer: [place]   Lot: [id number]   [place]: [phone]

## 2021-03-10 ENCOUNTER — Other Ambulatory Visit: Payer: Self-pay | Admitting: Internal Medicine

## 2021-03-10 DIAGNOSIS — E7800 Pure hypercholesterolemia, unspecified: Secondary | ICD-10-CM

## 2021-04-17 ENCOUNTER — Ambulatory Visit
Admission: RE | Admit: 2021-04-17 | Discharge: 2021-04-17 | Disposition: A | Discharge status: Home / Self Care | Payer: No Typology Code available for payment source | Source: Ambulatory Visit | Attending: Internal Medicine | Admitting: Internal Medicine

## 2021-04-17 ENCOUNTER — Other Ambulatory Visit: Payer: Self-pay

## 2021-04-17 DIAGNOSIS — E785 Hyperlipidemia, unspecified: Secondary | ICD-10-CM | POA: Diagnosis not present

## 2021-04-17 DIAGNOSIS — Z136 Encounter for screening for cardiovascular disorders: Secondary | ICD-10-CM | POA: Diagnosis not present

## 2021-04-17 DIAGNOSIS — E7800 Pure hypercholesterolemia, unspecified: Secondary | ICD-10-CM

## 2021-04-28 ENCOUNTER — Other Ambulatory Visit: Payer: Self-pay | Admitting: Internal Medicine

## 2021-04-28 DIAGNOSIS — Z1231 Encounter for screening mammogram for malignant neoplasm of breast: Secondary | ICD-10-CM

## 2021-05-10 ENCOUNTER — Other Ambulatory Visit (HOSPITAL_COMMUNITY): Payer: Self-pay

## 2021-05-10 MED ORDER — OMEGA-3-ACID ETHYL ESTERS 1 G PO CAPS
2.0000 g | ORAL_CAPSULE | Freq: Two times a day (BID) | ORAL | 11 refills | Status: DC
  Filled 2021-05-10 – 2022-03-09 (×2): qty 120, 30d supply, fill #0
  Filled 2022-05-04: qty 120, 30d supply, fill #1

## 2021-05-24 DIAGNOSIS — L668 Other cicatricial alopecia: Secondary | ICD-10-CM | POA: Diagnosis not present

## 2021-05-24 DIAGNOSIS — L821 Other seborrheic keratosis: Secondary | ICD-10-CM | POA: Diagnosis not present

## 2021-05-24 DIAGNOSIS — D2272 Melanocytic nevi of left lower limb, including hip: Secondary | ICD-10-CM | POA: Diagnosis not present

## 2021-05-24 DIAGNOSIS — L918 Other hypertrophic disorders of the skin: Secondary | ICD-10-CM | POA: Diagnosis not present

## 2021-05-24 DIAGNOSIS — D485 Neoplasm of uncertain behavior of skin: Secondary | ICD-10-CM | POA: Diagnosis not present

## 2021-05-24 DIAGNOSIS — L980 Pyogenic granuloma: Secondary | ICD-10-CM | POA: Diagnosis not present

## 2021-05-24 DIAGNOSIS — Z85828 Personal history of other malignant neoplasm of skin: Secondary | ICD-10-CM | POA: Diagnosis not present

## 2021-05-24 DIAGNOSIS — L661 Lichen planopilaris: Secondary | ICD-10-CM | POA: Diagnosis not present

## 2021-05-24 DIAGNOSIS — D1801 Hemangioma of skin and subcutaneous tissue: Secondary | ICD-10-CM | POA: Diagnosis not present

## 2021-05-24 DIAGNOSIS — D225 Melanocytic nevi of trunk: Secondary | ICD-10-CM | POA: Diagnosis not present

## 2021-05-24 DIAGNOSIS — D2262 Melanocytic nevi of left upper limb, including shoulder: Secondary | ICD-10-CM | POA: Diagnosis not present

## 2021-05-26 DIAGNOSIS — H5203 Hypermetropia, bilateral: Secondary | ICD-10-CM | POA: Diagnosis not present

## 2021-05-26 DIAGNOSIS — H524 Presbyopia: Secondary | ICD-10-CM | POA: Diagnosis not present

## 2021-05-26 DIAGNOSIS — H52223 Regular astigmatism, bilateral: Secondary | ICD-10-CM | POA: Diagnosis not present

## 2021-05-31 ENCOUNTER — Ambulatory Visit
Admission: RE | Admit: 2021-05-31 | Discharge: 2021-05-31 | Disposition: A | Discharge status: Home / Self Care | Payer: 59 | Source: Ambulatory Visit

## 2021-05-31 ENCOUNTER — Other Ambulatory Visit: Payer: Self-pay

## 2021-05-31 DIAGNOSIS — Z1231 Encounter for screening mammogram for malignant neoplasm of breast: Secondary | ICD-10-CM | POA: Diagnosis not present

## 2021-06-02 ENCOUNTER — Other Ambulatory Visit (HOSPITAL_COMMUNITY): Payer: Self-pay

## 2021-06-02 DIAGNOSIS — L661 Lichen planopilaris: Secondary | ICD-10-CM | POA: Diagnosis not present

## 2021-06-02 DIAGNOSIS — L649 Androgenic alopecia, unspecified: Secondary | ICD-10-CM | POA: Diagnosis not present

## 2021-06-02 DIAGNOSIS — Z85828 Personal history of other malignant neoplasm of skin: Secondary | ICD-10-CM | POA: Diagnosis not present

## 2021-06-02 MED ORDER — CLOBETASOL PROPIONATE 0.05 % EX SOLN
1.0000 "application " | Freq: Every day | CUTANEOUS | 3 refills | Status: AC
  Filled 2021-06-02: qty 50, 90d supply, fill #0
  Filled 2021-10-10: qty 50, 90d supply, fill #1

## 2021-08-03 ENCOUNTER — Other Ambulatory Visit (HOSPITAL_COMMUNITY): Payer: Self-pay

## 2021-08-03 DIAGNOSIS — Z85828 Personal history of other malignant neoplasm of skin: Secondary | ICD-10-CM | POA: Diagnosis not present

## 2021-08-03 DIAGNOSIS — L661 Lichen planopilaris: Secondary | ICD-10-CM | POA: Diagnosis not present

## 2021-08-07 ENCOUNTER — Other Ambulatory Visit (HOSPITAL_COMMUNITY): Payer: Self-pay

## 2021-08-07 MED ORDER — FINASTERIDE 5 MG PO TABS
2.5000 mg | ORAL_TABLET | Freq: Every day | ORAL | 1 refills | Status: DC
  Filled 2021-08-07: qty 23, 46d supply, fill #0
  Filled 2021-10-10: qty 23, 46d supply, fill #1

## 2021-10-10 ENCOUNTER — Other Ambulatory Visit (HOSPITAL_COMMUNITY): Payer: Self-pay

## 2021-11-21 DIAGNOSIS — Z01419 Encounter for gynecological examination (general) (routine) without abnormal findings: Secondary | ICD-10-CM | POA: Diagnosis not present

## 2021-11-21 DIAGNOSIS — Z6831 Body mass index (BMI) 31.0-31.9, adult: Secondary | ICD-10-CM | POA: Diagnosis not present

## 2021-11-28 ENCOUNTER — Other Ambulatory Visit (HOSPITAL_COMMUNITY): Payer: Self-pay

## 2021-11-28 DIAGNOSIS — L2089 Other atopic dermatitis: Secondary | ICD-10-CM | POA: Diagnosis not present

## 2021-11-28 DIAGNOSIS — Z85828 Personal history of other malignant neoplasm of skin: Secondary | ICD-10-CM | POA: Diagnosis not present

## 2021-11-28 MED ORDER — OPZELURA 1.5 % EX CREA
TOPICAL_CREAM | CUTANEOUS | 11 refills | Status: DC
  Filled 2021-11-28 – 2021-11-29 (×3): qty 60, 30d supply, fill #0
  Filled 2022-02-06: qty 60, 30d supply, fill #1
  Filled 2022-08-17 – 2022-09-04 (×2): qty 60, 30d supply, fill #2
  Filled 2022-11-20: qty 60, 30d supply, fill #3

## 2021-11-28 MED ORDER — DOXYCYCLINE HYCLATE 50 MG PO CAPS
50.0000 mg | ORAL_CAPSULE | Freq: Every day | ORAL | 1 refills | Status: AC
  Filled 2021-11-28: qty 90, 90d supply, fill #0

## 2021-11-29 ENCOUNTER — Other Ambulatory Visit (HOSPITAL_COMMUNITY): Payer: Self-pay

## 2021-11-30 ENCOUNTER — Other Ambulatory Visit (HOSPITAL_COMMUNITY): Payer: Self-pay

## 2021-12-12 ENCOUNTER — Other Ambulatory Visit (HOSPITAL_COMMUNITY): Payer: Self-pay

## 2021-12-13 ENCOUNTER — Other Ambulatory Visit (HOSPITAL_COMMUNITY): Payer: Self-pay

## 2021-12-15 ENCOUNTER — Other Ambulatory Visit (HOSPITAL_COMMUNITY): Payer: Self-pay

## 2021-12-20 ENCOUNTER — Other Ambulatory Visit (HOSPITAL_COMMUNITY): Payer: Self-pay

## 2021-12-21 DIAGNOSIS — Z0000 Encounter for general adult medical examination without abnormal findings: Secondary | ICD-10-CM | POA: Diagnosis not present

## 2021-12-22 ENCOUNTER — Other Ambulatory Visit (HOSPITAL_COMMUNITY): Payer: Self-pay

## 2021-12-26 ENCOUNTER — Other Ambulatory Visit (HOSPITAL_COMMUNITY): Payer: Self-pay

## 2021-12-27 ENCOUNTER — Other Ambulatory Visit (HOSPITAL_COMMUNITY): Payer: Self-pay

## 2021-12-27 DIAGNOSIS — Z0000 Encounter for general adult medical examination without abnormal findings: Secondary | ICD-10-CM | POA: Diagnosis not present

## 2021-12-27 DIAGNOSIS — E559 Vitamin D deficiency, unspecified: Secondary | ICD-10-CM | POA: Diagnosis not present

## 2021-12-27 MED ORDER — FINASTERIDE 5 MG PO TABS
2.5000 mg | ORAL_TABLET | Freq: Every day | ORAL | 1 refills | Status: DC
  Filled 2021-12-27: qty 23, 46d supply, fill #0
  Filled 2022-03-09: qty 23, 46d supply, fill #1

## 2022-02-06 ENCOUNTER — Other Ambulatory Visit (HOSPITAL_COMMUNITY): Payer: Self-pay

## 2022-02-09 ENCOUNTER — Other Ambulatory Visit (HOSPITAL_COMMUNITY): Payer: Self-pay

## 2022-02-21 DIAGNOSIS — Z13820 Encounter for screening for osteoporosis: Secondary | ICD-10-CM | POA: Diagnosis not present

## 2022-03-09 ENCOUNTER — Other Ambulatory Visit (HOSPITAL_COMMUNITY): Payer: Self-pay

## 2022-05-02 ENCOUNTER — Other Ambulatory Visit (HOSPITAL_COMMUNITY): Payer: Self-pay

## 2022-05-02 ENCOUNTER — Other Ambulatory Visit: Payer: Self-pay | Admitting: Internal Medicine

## 2022-05-02 DIAGNOSIS — Z1231 Encounter for screening mammogram for malignant neoplasm of breast: Secondary | ICD-10-CM

## 2022-05-04 ENCOUNTER — Other Ambulatory Visit (HOSPITAL_COMMUNITY): Payer: Self-pay

## 2022-05-07 ENCOUNTER — Other Ambulatory Visit (HOSPITAL_COMMUNITY): Payer: Self-pay

## 2022-05-07 MED ORDER — FINASTERIDE 5 MG PO TABS
2.5000 mg | ORAL_TABLET | Freq: Every day | ORAL | 3 refills | Status: AC
Start: 2022-05-07 — End: ?
  Filled 2022-05-07: qty 45, 90d supply, fill #0
  Filled 2022-08-17 – 2022-08-23 (×2): qty 45, 90d supply, fill #1
  Filled 2022-12-17: qty 45, 90d supply, fill #2
  Filled 2023-03-20: qty 45, 90d supply, fill #3

## 2022-05-31 DIAGNOSIS — L661 Lichen planopilaris: Secondary | ICD-10-CM | POA: Diagnosis not present

## 2022-05-31 DIAGNOSIS — Z85828 Personal history of other malignant neoplasm of skin: Secondary | ICD-10-CM | POA: Diagnosis not present

## 2022-06-22 ENCOUNTER — Ambulatory Visit
Admission: RE | Admit: 2022-06-22 | Discharge: 2022-06-22 | Disposition: A | Discharge status: Home / Self Care | Payer: Commercial Managed Care - PPO | Source: Ambulatory Visit | Attending: Internal Medicine | Admitting: Internal Medicine

## 2022-06-22 DIAGNOSIS — Z1231 Encounter for screening mammogram for malignant neoplasm of breast: Secondary | ICD-10-CM | POA: Diagnosis not present

## 2022-07-13 DIAGNOSIS — Z85828 Personal history of other malignant neoplasm of skin: Secondary | ICD-10-CM | POA: Diagnosis not present

## 2022-07-13 DIAGNOSIS — L918 Other hypertrophic disorders of the skin: Secondary | ICD-10-CM | POA: Diagnosis not present

## 2022-07-13 DIAGNOSIS — L821 Other seborrheic keratosis: Secondary | ICD-10-CM | POA: Diagnosis not present

## 2022-07-13 DIAGNOSIS — L570 Actinic keratosis: Secondary | ICD-10-CM | POA: Diagnosis not present

## 2022-07-13 DIAGNOSIS — D2262 Melanocytic nevi of left upper limb, including shoulder: Secondary | ICD-10-CM | POA: Diagnosis not present

## 2022-07-13 DIAGNOSIS — D2372 Other benign neoplasm of skin of left lower limb, including hip: Secondary | ICD-10-CM | POA: Diagnosis not present

## 2022-07-13 DIAGNOSIS — L720 Epidermal cyst: Secondary | ICD-10-CM | POA: Diagnosis not present

## 2022-07-13 DIAGNOSIS — D2272 Melanocytic nevi of left lower limb, including hip: Secondary | ICD-10-CM | POA: Diagnosis not present

## 2022-07-13 DIAGNOSIS — D1801 Hemangioma of skin and subcutaneous tissue: Secondary | ICD-10-CM | POA: Diagnosis not present

## 2022-07-13 DIAGNOSIS — L218 Other seborrheic dermatitis: Secondary | ICD-10-CM | POA: Diagnosis not present

## 2022-07-24 DIAGNOSIS — H5203 Hypermetropia, bilateral: Secondary | ICD-10-CM | POA: Diagnosis not present

## 2022-07-24 DIAGNOSIS — H2513 Age-related nuclear cataract, bilateral: Secondary | ICD-10-CM | POA: Diagnosis not present

## 2022-07-24 DIAGNOSIS — H18593 Other hereditary corneal dystrophies, bilateral: Secondary | ICD-10-CM | POA: Diagnosis not present

## 2022-07-24 DIAGNOSIS — H524 Presbyopia: Secondary | ICD-10-CM | POA: Diagnosis not present

## 2022-07-24 DIAGNOSIS — H52223 Regular astigmatism, bilateral: Secondary | ICD-10-CM | POA: Diagnosis not present

## 2022-08-17 ENCOUNTER — Other Ambulatory Visit (HOSPITAL_COMMUNITY): Payer: Self-pay

## 2022-08-17 MED ORDER — OMEGA-3-ACID ETHYL ESTERS 1 G PO CAPS
2.0000 | ORAL_CAPSULE | Freq: Two times a day (BID) | ORAL | 11 refills | Status: AC
  Filled 2022-08-17: qty 120, 30d supply, fill #0
  Filled 2023-03-04: qty 120, 30d supply, fill #1
  Filled 2023-05-30 – 2023-06-18 (×5): qty 120, 30d supply, fill #2

## 2022-08-23 ENCOUNTER — Other Ambulatory Visit (HOSPITAL_COMMUNITY): Payer: Self-pay

## 2022-08-27 ENCOUNTER — Other Ambulatory Visit (HOSPITAL_COMMUNITY): Payer: Self-pay

## 2022-09-04 ENCOUNTER — Other Ambulatory Visit (HOSPITAL_COMMUNITY): Payer: Self-pay

## 2022-09-04 ENCOUNTER — Other Ambulatory Visit: Payer: Self-pay

## 2022-11-20 ENCOUNTER — Other Ambulatory Visit (HOSPITAL_COMMUNITY): Payer: Self-pay

## 2022-11-20 ENCOUNTER — Other Ambulatory Visit: Payer: Self-pay

## 2022-11-21 ENCOUNTER — Other Ambulatory Visit: Payer: Self-pay

## 2022-12-09 IMAGING — CT CT CARDIAC CORONARY ARTERY CALCIUM SCORE
3 series · 14 of 20 positions shown, 16 images · non-contrast
Comparison: None.

CLINICAL DATA: 65-year-old Caucasian female with history of
hyperlipidemia.

EXAM:
CT CARDIAC CORONARY ARTERY CALCIUM SCORE
TECHNIQUE: Non-contrast imaging through the heart was performed using
prospective ECG gating. Image post processing was performed on an
independent workstation, allowing for quantitative analysis of the
heart and coronary arteries. Note that this exam targets the heart
and the chest was not imaged in its entirety.

[Series 2: calcium scoring 2.00 qr36 bestdiast 68% hrt calciu · axial · 0.36mm/px · z∈[+1692,+1776]mm · 4 of 70 slices shown]
[im 14/70  vessel]
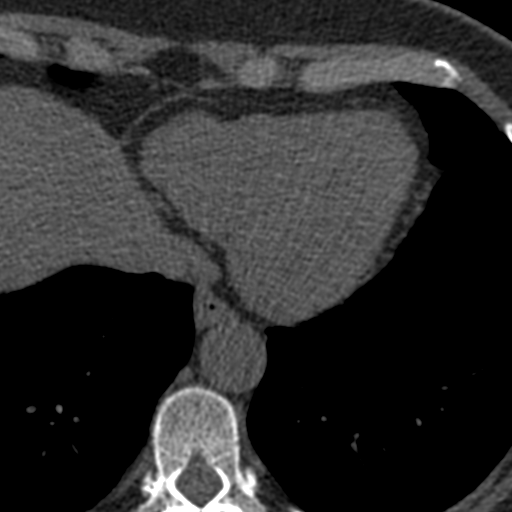
[im 28/70  vessel]
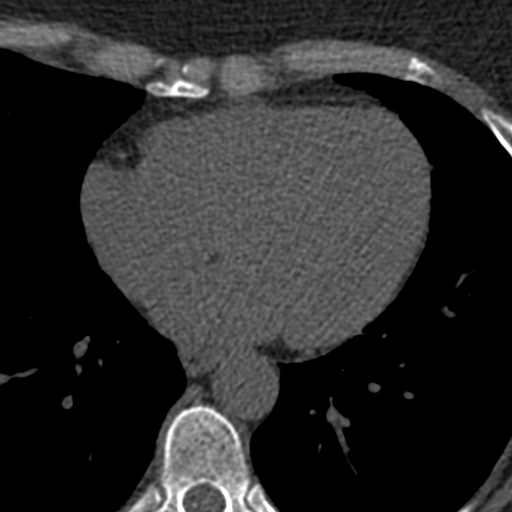
[im 42/70  vessel]
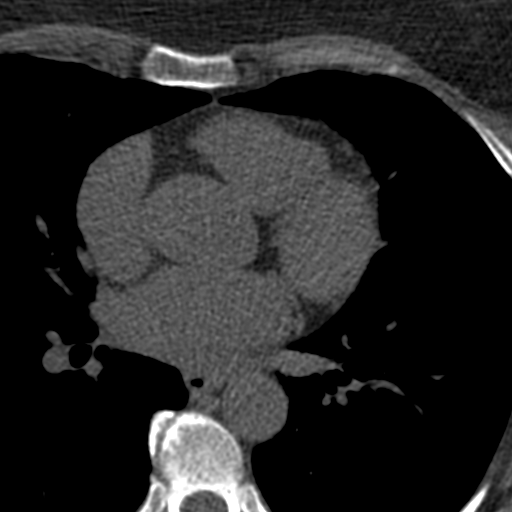
[im 56/70  vessel]
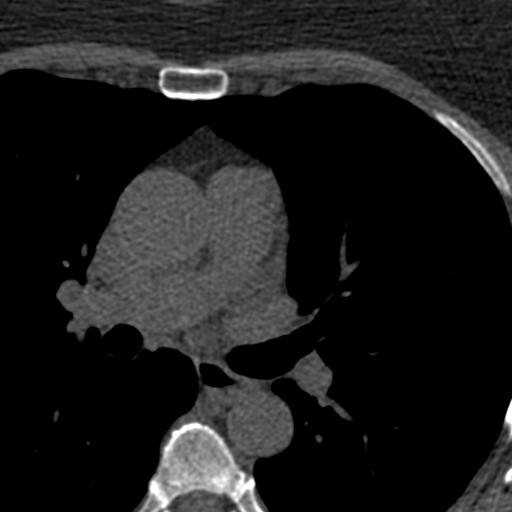

[Series 3: calcium scoring 2.00 br40 bestdiast 68% axial · axial · 0.55mm/px · z∈[+1688,+1780]mm · 5 of 70 slices shown, 7 images]
[im 12/70  vessel]
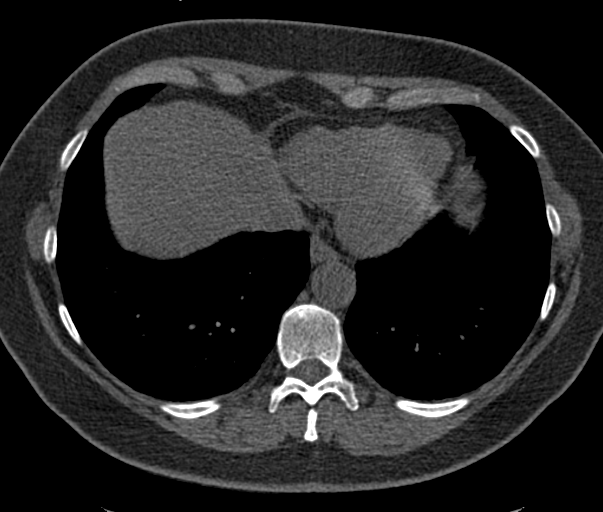
[im 12/70  lung]
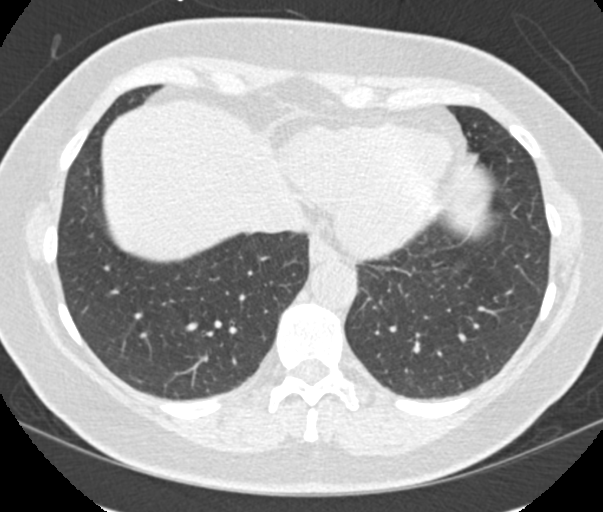
[im 24/70  vessel]
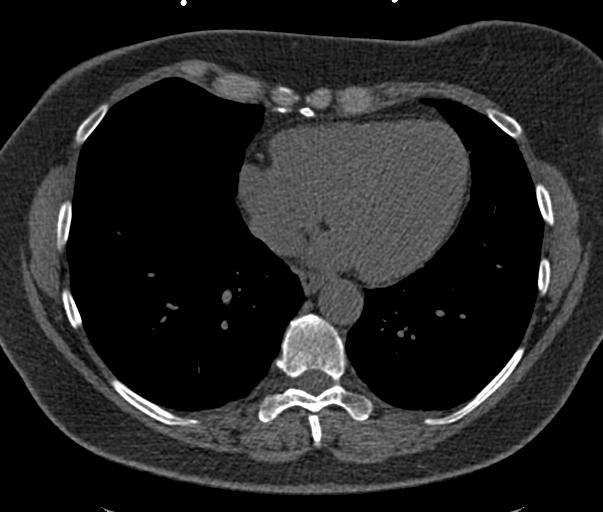
[im 35/70  vessel]
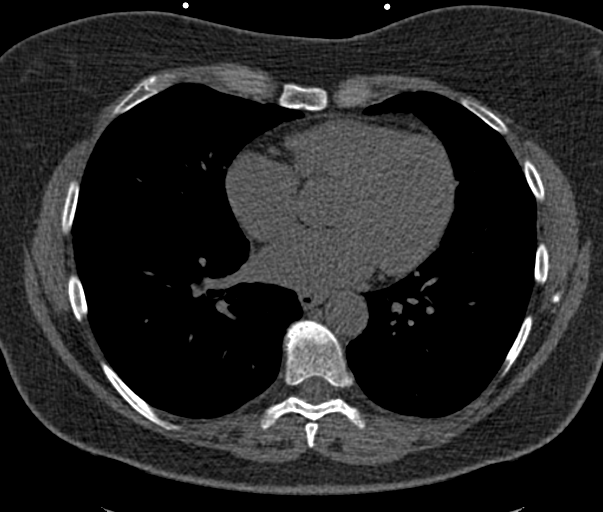
[im 47/70  vessel]
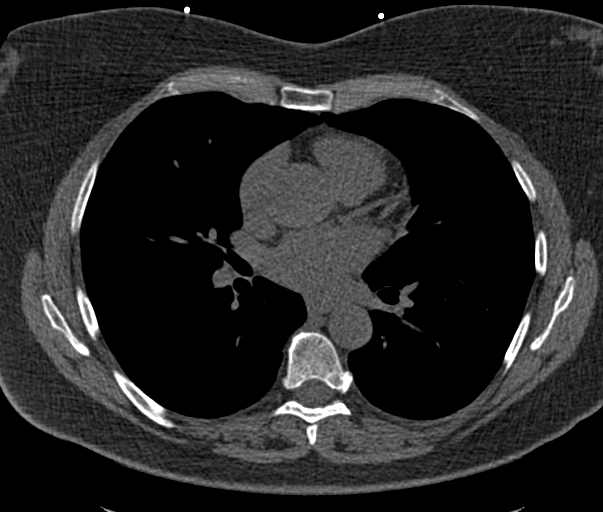
[im 58/70  vessel]
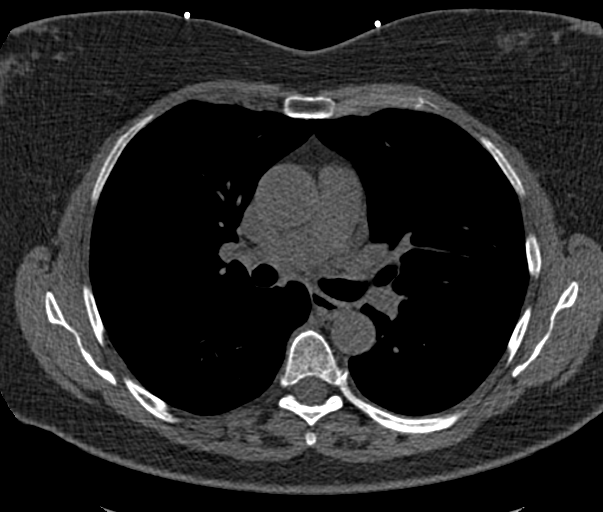
[im 58/70  lung]
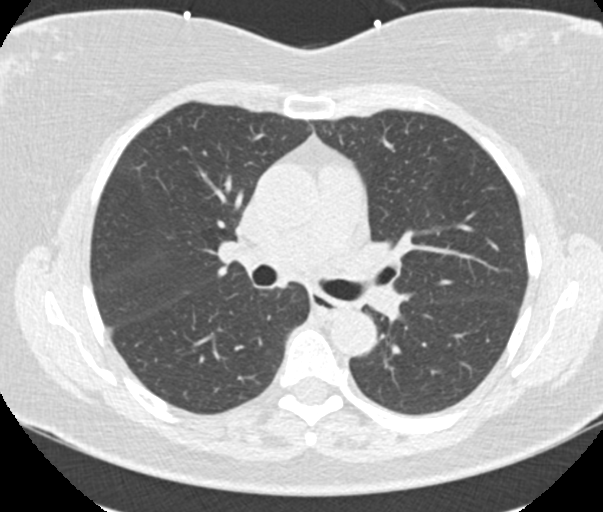

[Series 9: calcium scoring 2.00 br60 bestdiast 68% lungs · axial · 0.56mm/px · z∈[+1688,+1780]mm · 5 of 70 slices shown]
[im 12/70  vessel]
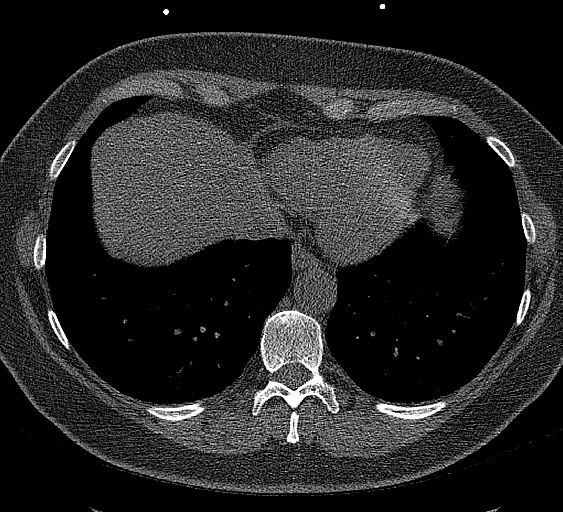
[im 24/70  vessel]
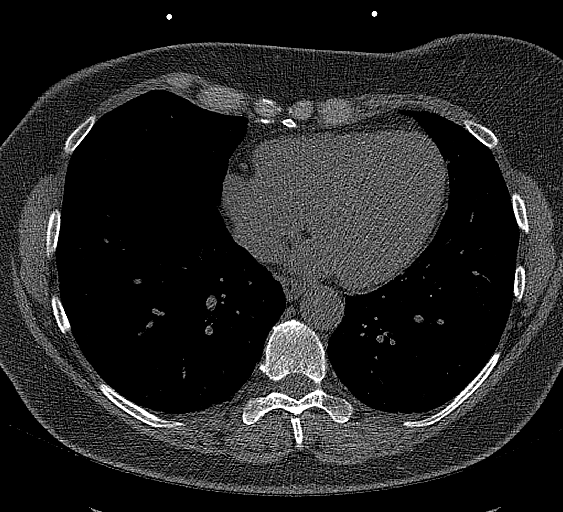
[im 35/70  vessel]
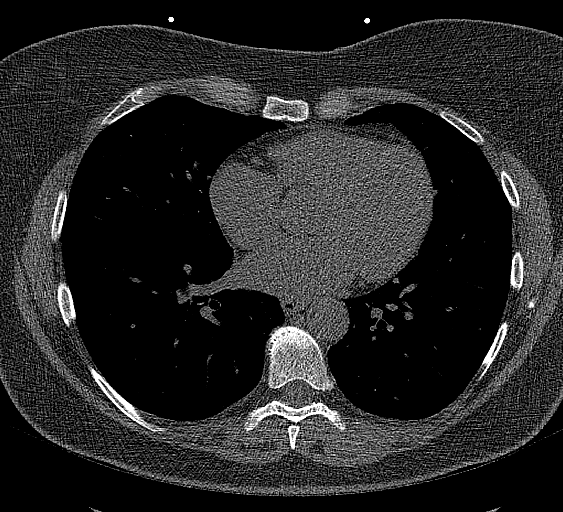
[im 47/70  vessel]
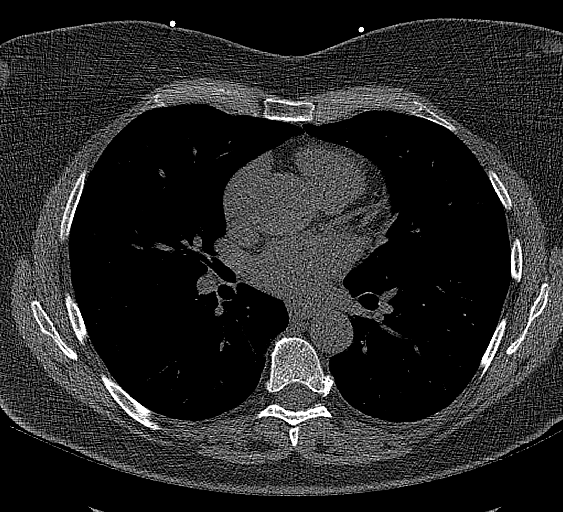
[im 58/70  vessel]
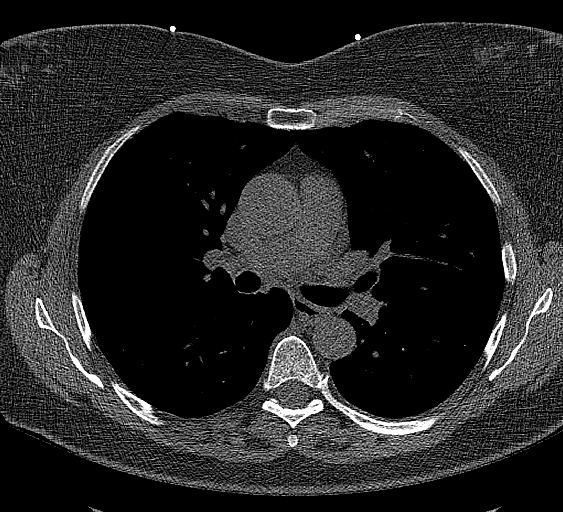

[14 of 20 positions shown; findings below may reference images not displayed]

FINDINGS: CORONARY CALCIUM SCORES:

Left Main: 0

LAD: 0

LCx: 0

RCA: 0

Total Agatston Score: 0

[HOSPITAL] percentile: 0

AORTA MEASUREMENTS:

Ascending Aorta: 35 mm

Descending Aorta: 23 mm

OTHER FINDINGS:


## 2022-12-24 ENCOUNTER — Other Ambulatory Visit (HOSPITAL_COMMUNITY): Payer: Self-pay

## 2022-12-26 DIAGNOSIS — E7800 Pure hypercholesterolemia, unspecified: Secondary | ICD-10-CM | POA: Diagnosis not present

## 2022-12-26 DIAGNOSIS — E559 Vitamin D deficiency, unspecified: Secondary | ICD-10-CM | POA: Diagnosis not present

## 2022-12-26 DIAGNOSIS — R748 Abnormal levels of other serum enzymes: Secondary | ICD-10-CM | POA: Diagnosis not present

## 2022-12-26 DIAGNOSIS — N029 Recurrent and persistent hematuria with unspecified morphologic changes: Secondary | ICD-10-CM | POA: Diagnosis not present

## 2022-12-26 DIAGNOSIS — R5383 Other fatigue: Secondary | ICD-10-CM | POA: Diagnosis not present

## 2023-01-02 DIAGNOSIS — Z0000 Encounter for general adult medical examination without abnormal findings: Secondary | ICD-10-CM | POA: Diagnosis not present

## 2023-01-02 DIAGNOSIS — E7800 Pure hypercholesterolemia, unspecified: Secondary | ICD-10-CM | POA: Diagnosis not present

## 2023-01-17 DIAGNOSIS — Z01419 Encounter for gynecological examination (general) (routine) without abnormal findings: Secondary | ICD-10-CM | POA: Diagnosis not present

## 2023-01-17 DIAGNOSIS — Z6830 Body mass index (BMI) 30.0-30.9, adult: Secondary | ICD-10-CM | POA: Diagnosis not present

## 2023-01-22 DIAGNOSIS — Z85828 Personal history of other malignant neoplasm of skin: Secondary | ICD-10-CM | POA: Diagnosis not present

## 2023-01-22 DIAGNOSIS — L2089 Other atopic dermatitis: Secondary | ICD-10-CM | POA: Diagnosis not present

## 2023-01-24 ENCOUNTER — Other Ambulatory Visit (HOSPITAL_COMMUNITY): Payer: Self-pay

## 2023-01-24 DIAGNOSIS — L661 Lichen planopilaris: Secondary | ICD-10-CM | POA: Diagnosis not present

## 2023-01-24 DIAGNOSIS — L658 Other specified nonscarring hair loss: Secondary | ICD-10-CM | POA: Diagnosis not present

## 2023-01-24 MED ORDER — MINOXIDIL 2.5 MG PO TABS
1.2500 mg | ORAL_TABLET | Freq: Every day | ORAL | 1 refills | Status: AC
Start: 2023-01-24 — End: ?
  Filled 2023-01-24: qty 45, 90d supply, fill #0
  Filled 2023-05-06: qty 45, 90d supply, fill #1

## 2023-02-04 ENCOUNTER — Other Ambulatory Visit (HOSPITAL_COMMUNITY): Payer: Self-pay

## 2023-02-04 MED ORDER — ROSUVASTATIN CALCIUM 10 MG PO TABS
10.0000 mg | ORAL_TABLET | Freq: Every day | ORAL | 5 refills | Status: AC
  Filled 2023-02-04: qty 30, 30d supply, fill #0

## 2023-02-06 ENCOUNTER — Other Ambulatory Visit: Payer: Self-pay | Admitting: Oncology

## 2023-02-06 DIAGNOSIS — Z006 Encounter for examination for normal comparison and control in clinical research program: Secondary | ICD-10-CM

## 2023-03-04 ENCOUNTER — Other Ambulatory Visit (HOSPITAL_COMMUNITY): Payer: Self-pay

## 2023-03-20 ENCOUNTER — Other Ambulatory Visit (HOSPITAL_COMMUNITY): Payer: Self-pay

## 2023-03-20 ENCOUNTER — Other Ambulatory Visit: Payer: Self-pay

## 2023-03-22 ENCOUNTER — Other Ambulatory Visit (HOSPITAL_COMMUNITY): Payer: Self-pay

## 2023-03-22 MED ORDER — RUXOLITINIB PHOSPHATE 1.5 % EX CREA
TOPICAL_CREAM | CUTANEOUS | 10 refills | Status: AC
Start: 2023-03-22 — End: ?
  Filled 2023-03-22: qty 60, 30d supply, fill #0
  Filled 2023-05-30: qty 60, 30d supply, fill #1

## 2023-04-30 ENCOUNTER — Other Ambulatory Visit (HOSPITAL_COMMUNITY)
Admission: RE | Admit: 2023-04-30 | Discharge: 2023-04-30 | Disposition: A | Discharge status: Home / Self Care | Payer: Self-pay | Source: Ambulatory Visit | Attending: Oncology | Admitting: Oncology

## 2023-04-30 DIAGNOSIS — Z006 Encounter for examination for normal comparison and control in clinical research program: Secondary | ICD-10-CM | POA: Insufficient documentation

## 2023-05-11 LAB — HELIX MOLECULAR SCREEN: Genetic Analysis Overall Interpretation: NEGATIVE

## 2023-05-11 LAB — GENECONNECT MOLECULAR SCREEN

## 2023-05-30 ENCOUNTER — Other Ambulatory Visit (HOSPITAL_COMMUNITY): Payer: Self-pay

## 2023-05-30 ENCOUNTER — Other Ambulatory Visit: Payer: Self-pay

## 2023-05-30 ENCOUNTER — Other Ambulatory Visit: Payer: Self-pay | Admitting: Internal Medicine

## 2023-05-30 DIAGNOSIS — Z1231 Encounter for screening mammogram for malignant neoplasm of breast: Secondary | ICD-10-CM

## 2023-05-31 ENCOUNTER — Other Ambulatory Visit: Payer: Self-pay

## 2023-06-03 ENCOUNTER — Other Ambulatory Visit (HOSPITAL_COMMUNITY): Payer: Self-pay

## 2023-06-10 ENCOUNTER — Other Ambulatory Visit (HOSPITAL_COMMUNITY): Payer: Self-pay

## 2023-06-17 ENCOUNTER — Other Ambulatory Visit (HOSPITAL_COMMUNITY): Payer: Self-pay

## 2023-06-18 ENCOUNTER — Other Ambulatory Visit (HOSPITAL_COMMUNITY): Payer: Self-pay

## 2023-06-25 ENCOUNTER — Ambulatory Visit
Admission: RE | Admit: 2023-06-25 | Discharge: 2023-06-25 | Disposition: A | Discharge status: Home / Self Care | Payer: Commercial Managed Care - PPO | Source: Ambulatory Visit | Attending: Internal Medicine | Admitting: Internal Medicine

## 2023-06-25 DIAGNOSIS — Z1231 Encounter for screening mammogram for malignant neoplasm of breast: Secondary | ICD-10-CM

## 2024-02-03 ENCOUNTER — Encounter (HOSPITAL_BASED_OUTPATIENT_CLINIC_OR_DEPARTMENT_OTHER): Payer: Self-pay | Admitting: Internal Medicine

## 2024-02-03 ENCOUNTER — Ambulatory Visit (HOSPITAL_BASED_OUTPATIENT_CLINIC_OR_DEPARTMENT_OTHER): Admitting: Internal Medicine

## 2024-02-03 VITALS — BP 124/72 | HR 72 | Ht 65.0 in | Wt 177.6 lb

## 2024-02-03 DIAGNOSIS — E785 Hyperlipidemia, unspecified: Secondary | ICD-10-CM

## 2024-02-03 DIAGNOSIS — E7841 Elevated Lipoprotein(a): Secondary | ICD-10-CM | POA: Diagnosis not present

## 2024-02-03 DIAGNOSIS — Z79899 Other long term (current) drug therapy: Secondary | ICD-10-CM

## 2024-02-03 NOTE — Progress Notes (Unsigned)
 LIPID CLINIC CONSULT NOTE  Chief Complaint:  Manage dyslipidemia  Primary Care Physician: Clarice Nottingham, MD  Primary Cardiologist:  None  HPI:  Brooke [name] is a 68 y.o. female who is being seen today for the evaluation of dyslipidemia at the request of Royden Ronal Czar, FNP.  Is a pleasant 68 year old female kindly referred for evaluation management of dyslipidemia.  She has a history of high cholesterol and had an elevated LP(a) in July 2024 but 142.5 nmol/L.  She did have a prior coronary calcium  score in 2022 which was 0.  She also underwent free helix genetic test screening which looks at 4 FH genes and this was negative in 2024.  She has been on rosuvastatin  10 mg daily in addition to omega-3's.  Her recent lipid profile showed total cholesterol 254, triglycerides 207, HDL 53 and LDL 163.  PMHx:  Past Medical History:  Diagnosis Date   Allergy    History of abnormal cervical Pap smear    PMB (postmenopausal bleeding)    Thickened endometrium    Uterine cancer [facility]     Past Surgical History:  Procedure Laterality Date   CERVICAL CONE BIOPSY     HYSTEROSCOPY WITH D & C N/A 08/24/2015   Procedure: DILATATION AND CURETTAGE /HYSTEROSCOPY;  Surgeon: LELON Tanda Mulch, MD;  [facility]: [facility];  Service: Gynecology;  Laterality: N/A;    FAMHx:  Family History  Adopted: Yes  Problem Relation Age of Onset   Breast cancer Neg Hx     SOCHx:   reports that she has never smoked. She has never used smokeless tobacco. She reports current alcohol use of about 2.0 standard drinks of alcohol per week. She reports that she does not use drugs.  ALLERGIES:  Allergies  Allergen Reactions   Penicillins Swelling    Rash,swelling on face,upper chest   Strawberry (Diagnostic) Rash    ROS: Pertinent items noted in HPI and remainder of comprehensive ROS otherwise negative.  HOME MEDS: Current Outpatient Medications on File Prior to Visit  Medication Sig  Dispense Refill   calcium  carbonate (OS-CAL - DOSED IN MG OF ELEMENTAL CALCIUM ) 1250 (500 Ca) MG tablet Take 1 tablet by mouth daily. Switched to Slow Cal- takes one a day     cetirizine (ZYRTEC) 10 MG tablet Take 10 mg by mouth daily.     cholecalciferol (VITAMIN D) 25 MCG (1000 UNIT) tablet Take 1,000 Units by mouth daily.     EYSUVIS 0.25 % SUSP Apply 1 drop to eye in the morning and at bedtime.     finasteride  (PROSCAR ) 5 MG tablet Take 1/2 tablet by mouth once a day 45 tablet 3   minoxidil  (LONITEN ) 2.5 MG tablet Take 0.5 tablets (1.25 mg total) by mouth daily. 45 tablet 1   omega-3 acid ethyl esters (LOVAZA ) 1 g capsule Take 2 capsules (2 g total) by mouth 2 (two) times daily. 120 capsule 11   rosuvastatin  (CRESTOR ) 10 MG tablet Take 1 tablet (10 mg total) by mouth daily. 30 tablet 5   Ruxolitinib  Phosphate (OPZELURA ) 1.5 % CREA Apply as directed to affected area twice a day. Apply twice a day in the morning and at night over hair stem 60 g 10   clobetasol  (TEMOVATE ) 0.05 % external solution Apply 1 ml to scalp once a day for 1 week, alternating one week on  & one week off. 50 mL 3   COVID-19 mRNA Vac-TriS, Pfizer, (PFIZER-BIONT COVID-19 VAC-TRIS) SUSP injection Inject  into the muscle. 0.3 mL 0   doxycycline  (VIBRAMYCIN ) 50 MG capsule Take 1 capsule (50 mg total) by mouth daily with food, use sun protection 90 capsule 1   omega-3 acid ethyl esters (LOVAZA ) 1 g capsule daily.  (Patient not taking: Reported on 11/04/2020)     omega-3 acid ethyl esters (LOVAZA ) 1 g capsule Take 2 capsules (2 g total) by mouth 2 (two) times daily. 120 capsule 11   omega-3 acid ethyl esters (LOVAZA ) 1 g capsule Take 2 capsules (2 g total) by mouth 2 (two) times daily. 120 capsule 11   No current facility-administered medications on file prior to visit.    LABS/IMAGING: No results found for this or any previous visit (from the past 48 hours). No results found.  LIPID PANEL: No results found for: CHOL,  TRIG, HDL, CHOLHDL, VLDL, LDLCALC, LDLDIRECT  No results found for: LIPOA   WEIGHTS: Wt Readings from Last 3 Encounters:  02/03/24 177 lb 9.6 oz (80.6 kg)  11/04/20 170 lb (77.1 kg)  05/09/20 171 lb 6.4 oz (77.7 kg)    VITALS: BP 124/72   Pulse 72   Ht 5' 5 (1.651 m)   Wt 177 lb 9.6 oz (80.6 kg)   SpO2 96%   BMI 29.55 kg/m   EXAM: Deferred  EKG: Deferred  ASSESSMENT: Dyslipidemia, goal LDL less than 70 Elevated LP(a) 142.5 nmol/L 0 CAC score (2022)  PLAN: 1.   Ms. Drakeford has a dyslipidemia but remains above target LDL less than 70 on rosuvastatin  10 mg daily and omega-3's.  She reports not being as consistent with the medication but has been taking it regularly over the past month.  I advised at least 2 more months of daily statin therapy with a plan to repeat lipid testing fasting in October.  Based on those results we may need to further alter her medication or add therapy such as a PCSK9 inhibitor.  Thanks again for the kind referral.  Vinie KYM Maxcy, MD, [facility], FNLA, FACP  [place]  [facility]  Medical Director of the [facility] Lipid [facility]  Cardiovascular Risk Reduction Clinic Diplomate of the American Board of Clinical Lipidology Attending Cardiologist  Direct Dial: [phone]  Fax: [phone]  Website:  [email or web address]  Vinie BROCKS Bradd Merlos 02/03/2024, 2:23 PM

## 2024-02-03 NOTE — Patient Instructions (Signed)
 Medication Instructions:   Your physician recommends that you continue on your current medications as directed. Please refer to the Current Medication list given to you today.  *If you need a refill on your cardiac medications before your next appointment, please call your pharmacy*  Lab Work:  IN 2 MONTHS (AROUND OCTOBER)--NMR LIPOPROFILE--PLEASE COME FASTING TO THIS LAB APPOINTMENT--LAB IS LOCATED ON THE 3rd FLOOR AT [facility] [address]  If you have labs (blood work) drawn today and your tests are completely normal, you will receive your results only by: MyChart Message (if you have MyChart) OR A paper copy in the mail If you have any lab test that is abnormal or we need to change your treatment, we will call you to review the results.    Follow-Up:  TBD WITH DR. HILTY BASED ON YOUR LAB RESULTS

## 2024-03-18 DIAGNOSIS — N958 Other specified menopausal and perimenopausal disorders: Secondary | ICD-10-CM | POA: Diagnosis not present

## 2024-04-13 DIAGNOSIS — H0288A Meibomian gland dysfunction right eye, upper and lower eyelids: Secondary | ICD-10-CM | POA: Diagnosis not present

## 2024-04-13 DIAGNOSIS — H16223 Keratoconjunctivitis sicca, not specified as Sjogren's, bilateral: Secondary | ICD-10-CM | POA: Diagnosis not present

## 2024-04-13 DIAGNOSIS — H0288B Meibomian gland dysfunction left eye, upper and lower eyelids: Secondary | ICD-10-CM | POA: Diagnosis not present

## 2024-04-15 DIAGNOSIS — L6612 Frontal fibrosing alopecia: Secondary | ICD-10-CM | POA: Diagnosis not present

## 2024-04-15 DIAGNOSIS — L661 Lichen planopilaris, unspecified: Secondary | ICD-10-CM | POA: Diagnosis not present

## 2024-04-28 DIAGNOSIS — Z79899 Other long term (current) drug therapy: Secondary | ICD-10-CM | POA: Diagnosis not present

## 2024-04-28 DIAGNOSIS — E785 Hyperlipidemia, unspecified: Secondary | ICD-10-CM | POA: Diagnosis not present

## 2024-04-28 DIAGNOSIS — Z23 Encounter for immunization: Secondary | ICD-10-CM | POA: Diagnosis not present

## 2024-04-29 LAB — NMR, LIPOPROFILE
Cholesterol, Total: 169 mg/dL (ref 100–199)
HDL Particle Number: 37.7 umol/L (ref >=30.5)
HDL-C: 60 mg/dL (ref >39)
LDL Particle Number: 1043 nmol/L — ABNORMAL HIGH (ref <1000)
LDL Size: 21.5 nm (ref >20.5)
LDL-C (NIH Calc): 87 mg/dL (ref 0–99)
LP-IR Score: 30 (ref <=45)
Small LDL Particle Number: 353 nmol/L (ref <=527)
Triglycerides: 123 mg/dL (ref 0–149)

## 2024-05-04 ENCOUNTER — Ambulatory Visit: Payer: Self-pay | Admitting: Internal Medicine

## 2024-05-26 ENCOUNTER — Other Ambulatory Visit: Payer: Self-pay | Admitting: Registered Nurse

## 2024-05-26 ENCOUNTER — Other Ambulatory Visit: Payer: Self-pay | Admitting: Internal Medicine

## 2024-05-26 DIAGNOSIS — Z1231 Encounter for screening mammogram for malignant neoplasm of breast: Secondary | ICD-10-CM

## 2024-06-25 ENCOUNTER — Ambulatory Visit
Admission: RE | Admit: 2024-06-25 | Discharge: 2024-06-25 | Disposition: A | Discharge status: Home / Self Care | Source: Ambulatory Visit

## 2024-06-25 DIAGNOSIS — Z1231 Encounter for screening mammogram for malignant neoplasm of breast: Secondary | ICD-10-CM
